# Patient Record
Sex: Female | Born: 1965 | Race: White | Hispanic: No | Smoking: Current every day smoker
Health system: Southern US, Community
[De-identification: ages and names within clinical notes are randomized; demographics above are authoritative.]

## PROBLEM LIST (undated history)

## (undated) HISTORY — PX: TUBAL LIGATION: SHX77

## (undated) HISTORY — PX: TONSILLECTOMY: SUR1361

---

## 1998-05-12 ENCOUNTER — Emergency Department (HOSPITAL_COMMUNITY): Admission: EM | Admit: 1998-05-12 | Discharge: 1998-05-12 | Payer: Self-pay | Admitting: Emergency Medicine

## 1998-11-15 ENCOUNTER — Emergency Department (HOSPITAL_COMMUNITY): Admission: EM | Admit: 1998-11-15 | Discharge: 1998-11-15 | Payer: Self-pay | Admitting: Internal Medicine

## 1998-11-15 ENCOUNTER — Encounter: Payer: Self-pay | Admitting: Internal Medicine

## 1999-03-29 ENCOUNTER — Emergency Department (HOSPITAL_COMMUNITY): Admission: EM | Admit: 1999-03-29 | Discharge: 1999-03-29 | Payer: Self-pay | Admitting: *Deleted

## 1999-03-29 ENCOUNTER — Encounter: Payer: Self-pay | Admitting: *Deleted

## 2000-08-13 ENCOUNTER — Emergency Department (HOSPITAL_COMMUNITY): Admission: EM | Admit: 2000-08-13 | Discharge: 2000-08-13 | Payer: Self-pay | Admitting: *Deleted

## 2003-05-10 ENCOUNTER — Encounter: Payer: Self-pay | Admitting: Emergency Medicine

## 2003-05-10 ENCOUNTER — Emergency Department (HOSPITAL_COMMUNITY): Admission: AD | Admit: 2003-05-10 | Discharge: 2003-05-10 | Payer: Self-pay | Admitting: Emergency Medicine

## 2003-12-21 ENCOUNTER — Emergency Department (HOSPITAL_COMMUNITY): Admission: AC | Admit: 2003-12-21 | Discharge: 2003-12-21 | Payer: Self-pay

## 2004-07-30 ENCOUNTER — Emergency Department (HOSPITAL_COMMUNITY): Admission: EM | Admit: 2004-07-30 | Discharge: 2004-07-30 | Payer: Self-pay | Admitting: Emergency Medicine

## 2004-09-05 ENCOUNTER — Emergency Department (HOSPITAL_COMMUNITY): Admission: EM | Admit: 2004-09-05 | Discharge: 2004-09-05 | Payer: Self-pay | Admitting: Emergency Medicine

## 2006-09-18 ENCOUNTER — Emergency Department (HOSPITAL_COMMUNITY): Admission: EM | Admit: 2006-09-18 | Discharge: 2006-09-18 | Payer: Self-pay | Admitting: Emergency Medicine

## 2007-08-22 ENCOUNTER — Emergency Department (HOSPITAL_COMMUNITY): Admission: EM | Admit: 2007-08-22 | Discharge: 2007-08-22 | Payer: Self-pay | Admitting: Emergency Medicine

## 2007-08-26 ENCOUNTER — Ambulatory Visit (HOSPITAL_COMMUNITY): Admission: RE | Admit: 2007-08-26 | Discharge: 2007-08-26 | Payer: Self-pay | Admitting: Gastroenterology

## 2007-08-26 ENCOUNTER — Encounter (INDEPENDENT_AMBULATORY_CARE_PROVIDER_SITE_OTHER): Payer: Self-pay | Admitting: Gastroenterology

## 2007-09-17 ENCOUNTER — Encounter: Admission: RE | Admit: 2007-09-17 | Discharge: 2007-09-17 | Payer: Self-pay | Admitting: Gastroenterology

## 2007-10-25 ENCOUNTER — Emergency Department (HOSPITAL_COMMUNITY): Admission: EM | Admit: 2007-10-25 | Discharge: 2007-10-25 | Payer: Self-pay | Admitting: Emergency Medicine

## 2007-10-26 ENCOUNTER — Ambulatory Visit: Payer: Self-pay | Admitting: Psychiatry

## 2007-10-26 ENCOUNTER — Inpatient Hospital Stay (HOSPITAL_COMMUNITY): Admission: RE | Admit: 2007-10-26 | Discharge: 2007-10-28 | Payer: Self-pay | Admitting: Psychiatry

## 2008-06-25 ENCOUNTER — Emergency Department (HOSPITAL_BASED_OUTPATIENT_CLINIC_OR_DEPARTMENT_OTHER): Admission: EM | Admit: 2008-06-25 | Discharge: 2008-06-25 | Payer: Self-pay | Admitting: Emergency Medicine

## 2008-06-27 ENCOUNTER — Emergency Department (HOSPITAL_BASED_OUTPATIENT_CLINIC_OR_DEPARTMENT_OTHER): Admission: EM | Admit: 2008-06-27 | Discharge: 2008-06-27 | Payer: Self-pay | Admitting: Emergency Medicine

## 2008-07-21 ENCOUNTER — Emergency Department (HOSPITAL_BASED_OUTPATIENT_CLINIC_OR_DEPARTMENT_OTHER): Admission: EM | Admit: 2008-07-21 | Discharge: 2008-07-21 | Payer: Self-pay | Admitting: Emergency Medicine

## 2009-04-10 ENCOUNTER — Emergency Department (HOSPITAL_BASED_OUTPATIENT_CLINIC_OR_DEPARTMENT_OTHER): Admission: EM | Admit: 2009-04-10 | Discharge: 2009-04-10 | Payer: Self-pay | Admitting: Emergency Medicine

## 2009-04-12 ENCOUNTER — Emergency Department (HOSPITAL_BASED_OUTPATIENT_CLINIC_OR_DEPARTMENT_OTHER): Admission: EM | Admit: 2009-04-12 | Discharge: 2009-04-12 | Payer: Self-pay | Admitting: Emergency Medicine

## 2009-12-16 ENCOUNTER — Emergency Department (HOSPITAL_BASED_OUTPATIENT_CLINIC_OR_DEPARTMENT_OTHER): Admission: EM | Admit: 2009-12-16 | Discharge: 2009-12-16 | Payer: Self-pay | Admitting: Emergency Medicine

## 2009-12-16 ENCOUNTER — Ambulatory Visit: Payer: Self-pay | Admitting: Diagnostic Radiology

## 2010-05-14 ENCOUNTER — Emergency Department (HOSPITAL_BASED_OUTPATIENT_CLINIC_OR_DEPARTMENT_OTHER): Admission: EM | Admit: 2010-05-14 | Discharge: 2010-05-14 | Payer: Self-pay | Admitting: Emergency Medicine

## 2011-02-24 NOTE — H&P (Signed)
NAMECATARINA, [name] NO.:  [id number]   MEDICAL RECORD NO.:  [id number]          PATIENT TYPE:  IPS   LOCATION:  0306                           FACILITY:   PHYSICIAN:  Jasmine Pang, M.D. DATE OF BIRTH:  Oct 27, 1965   DATE OF ADMISSION:  10/26/2007  DATE OF DISCHARGE:                       PSYCHIATRIC ADMISSION ASSESSMENT   HISTORY OF PRESENT ILLNESS:  The patient reports feeling depressed,  feeling very overwhelmed, states she has cycles of depression and her  depression right now is at its worse.  She has been experiencing mood  swings, feeling impatient and anxious and irritable with decreased  energy, decreased sleep and decreased appetite.  She states that she  denies any specific stressors although she does admit she is  experiencing some stress at work.  Her adult son is making some bad choices.  She has a history of problems  with nausea, vomiting.  Had an extensive workup.   PAST PSYCHIATRIC HISTORY:  First admission to the [facility].  Has  had appointment with a therapist on January 24, that will be a first  appointment, for [name].   SOCIAL HISTORY:  The patient resides in [place].  She is a 45-year-  old single female, has two children, age 26 and 19.  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  She states she has a boyfriend who she  states is supportive.   FAMILY HISTORY:  Mother with depression, not on any medications.   ALCOHOL AND DRUG HISTORY:  The patient smokes, drinks alcohol on rare  occasions, smokes marijuana joint daily.  Primary care Delina Kruczek [facility]  Her gastroenterologist is Dr. Elnoria Howard.  Medical problems are GERD.   MEDICATIONS:  Been on Zoloft 50 mg to start a few days prior and some  Klonopin prescribed by Dani Gobble.   DRUG ALLERGIES:  No known allergies.   PHYSICAL EXAM:  The patient was fully assessed at [facility] emergency  department.  Her temperature is  97.6, 62 heart rate, 16 respirations.  Her blood pressure is 116/67, she is 5 feet 5 inches tall, 100 pounds.  Her urine drug screen positive for benzodiazepines, positive for THC.  BMET is within normal limits.  Alcohol level less than 5, MCV is 105.1.   MENTAL STATUS EXAM:  She is fully alert, cooperative, casually dressed,  fair eye contact, thin, her speech is soft-spoken and normal pace.  Mood  is depressed.  Affect is somewhat flat, got teary-eyed a few times  throughout the interview and appears sad.  Her thought process are  coherent and goal directed.  No evidence of any thought disorder,  cognitive function intact.  Memory is good.  Judgment, insight is fair.  AXIS I:  Mood disorder NOS.  THC abuse.  AXIS II:  Deferred.  AXIS III:  Gastroesophageal reflux disease.  AXIS IV:  Psychosocial problems.  Medical problems, possible problems  with occupation.  AXIS V:  Current is 45   Plan to contract for safety.  We will stabilize mood, thinking.  Was  noted the  patient did sign her 72 hour request for discharge.  She is  agreeable to staying, to begin Seroquel at bedtime to help stabilize her  mood.  Risks and benefits were discussed.  The patient is agreeable.  Will continue with the Zoloft for now.  Case manager is to ascertain her  follow-up, will also address her substance use.  Her tentative length of  stay is 2-3 days.      Landry Corporal, N.P.      Jasmine Pang, M.D.  Electronically Signed    JO/MEDQ  D:  10/27/2007  T:  10/27/2007  Job:  [id number]

## 2011-02-24 NOTE — Discharge Summary (Signed)
[name], [name] NO.:  [id number]   MEDICAL RECORD NO.:  [id number]          PATIENT TYPE:  IPS   LOCATION:  0306                          FACILITY:  [facility]   PHYSICIAN:  Jasmine Pang, M.D. DATE OF BIRTH:  May 05, 1966   DATE OF ADMISSION:  10/26/2007  DATE OF DISCHARGE:  10/28/2007                               DISCHARGE SUMMARY   IDENTIFICATION:  This is a 45 year old single female who was admitted on  October 26, 2007.   HISTORY OF PRESENT ILLNESS:  The patient reports feeling depressed,  feeling very overwhelmed.  She states she has cycles of depression and  depression right now is at its worst.  She has been experiencing mood  swings, feeling impatient, anxious, irritable with decreased energy,  decreased sleep, and decreased appetite.  She states that she denies any  specific stressors, although she does admit she is experiencing some  stress at work.  Her adult son is making some bad choices, which is  upsetting for her.  She has a history of problems with nausea and  vomiting.  She had an extensive workup for this.   PAST PSYCHIATRIC HISTORY:  This is the first admission to the [facility].  She has an appointment with a therapist on  November 05, 2007, and that will be the first appointment for [name].   FAMILY HISTORY:  Mother has a history of depression.  She is not on any  medications.   ALCOHOL AND DRUG HISTORY:  The patient smokes, drinks alcohol on rare  occasions, and smokes marijuana joint daily.   MEDICAL PROBLEMS:  Gastroesophageal reflux disease.   MEDICATIONS:  Zoloft 50 mg daily, which was just started a few days  prior and Klonopin prescribed by Payton Spark, her primary care  Odyn Turko.   DRUG ALLERGIES:  No known drug allergies.   PHYSICAL FINDINGS:  The patient was fully assessed at [facility]  Emergency Department.  There were no acute physical or medical problems  noted.   DIAGNOSTIC STUDIES:  Urine drug screen was positive for benzodiazepines  and positive for THC.  BMET was within normal limits.  Alcohol level was  less than 5.  MCV was 105.1.   HOSPITAL COURSE:  Upon admission, the patient was continued on Zoloft 50  mg p.o. q.h.s.  She was also started on Seroquel 50 mg p.o. q.h.s. and  Seroquel 50 mg p.o. q.6 h. p.r.n. anxiety.  On October 28, 2007, due to  sedation, her Seroquel was decreased to 25 mg p.o. q.6 h. p.r.n.  anxiety.  The patient tolerated these medications well except for the  initial sedation onthe larger dose of Seroquel.  She was initially  reserved, but cooperative in individual sessions with me.  She discussed  her depression and feeling overwhelmed.  She states she came in to the  hospital voluntarily.  She states she has some stress including worry  about her 77 year old son and her mother, as well as relationship  problems.  She immediately denied suicidal ideation when I met with her  and  states that she would not do this.  On October 28, 2007, mental  status had improved from admission status.  The patient's sleep was  good.  Appetite was good.  Mood was less depressed and less anxious.  Affect was consistent with mood.  There was no suicidal or homicidal  ideation.  No thoughts of self-injurious behavior.  No auditory or  visual hallucinations.  No paranoia or delusions.  Thoughts were logical  and goal-directed.  Thought content, no predominant theme.  Cognitive  was grossly back to baseline.  She wanted to go home that day and was  felt to be safe for discharge.   DISCHARGE DIAGNOSES:  Axis I:  Mood disorder, not otherwise specified.  Tetrahydrocannabinol abuse.  Axis II:  None.  Axis III:  Gastroesophageal reflux disease.  Axis IV:  Moderate (psychosocial problems, medical problems, possible  problems with occupation, and burden of psychiatric illness).  Axis V:  Global assessment of functioning  upon discharge was 50.   Global assessment of functioningupon admission was 35.  Global  assessment of functioning, highest past year was 65.   DISCHARGE PLANS:  There are no specific activity level or dietary  restrictions.   POST HOSPITAL CARE PLANS:  The patient will see Dr. Evelene Croon on December 10, 2007, at 4:15 p.m.   DISCHARGE MEDICATIONS:  1. Seroquel 25 mg at bedtime and 25 mg q.6 h. if needed for anxiety.  2. Zoloft 50 mg at bedtime.  3. Ambien 5 mg at bedtime p.r.n. insomnia.      Jasmine Pang, M.D.  Electronically Signed     BHS/MEDQ  D:  11/16/2007  T:  11/17/2007  Job:  [id number]

## 2011-07-02 LAB — CBC
Platelets: 248
RBC: 3.57 — ABNORMAL LOW
WBC: 6.5

## 2011-07-02 LAB — BASIC METABOLIC PANEL
CO2: 24
Calcium: 9
Chloride: 108
GFR calc Af Amer: >60
GFR calc non Af Amer: >60
Glucose, Bld: 88

## 2011-07-02 LAB — DIFFERENTIAL
Basophils Relative: 1
Eosinophils Absolute: 0.1
Lymphs Abs: 1.3
Monocytes Absolute: 0.4

## 2011-07-02 LAB — I-STAT 8, (EC8 V) (CONVERTED LAB)
Acid-base deficit: 1
Bicarbonate: 23.6
Glucose, Bld: 88
Hemoglobin: 13.9
Operator id: 198171
Potassium: 4.2
pCO2, Ven: 38.6 — ABNORMAL LOW
pH, Ven: 7.394 — ABNORMAL HIGH

## 2011-07-02 LAB — RAPID URINE DRUG SCREEN, HOSP PERFORMED
Cocaine: NOT DETECTED
Tetrahydrocannabinol: POSITIVE — AB

## 2011-07-14 LAB — WOUND CULTURE

## 2011-07-21 LAB — COMPREHENSIVE METABOLIC PANEL
Calcium: 8.9
Chloride: 105
GFR calc Af Amer: >60
GFR calc non Af Amer: >60
Glucose, Bld: 95
Total Bilirubin: 0.4

## 2011-07-21 LAB — URINALYSIS, ROUTINE W REFLEX MICROSCOPIC
Bilirubin Urine: NEGATIVE
Ketones, ur: NEGATIVE
Nitrite: POSITIVE — AB
Protein, ur: NEGATIVE
Specific Gravity, Urine: 1.021
Urobilinogen, UA: 0.2

## 2011-07-21 LAB — WET PREP, GENITAL

## 2011-07-21 LAB — CBC
MCHC: 33.8
RBC: 3.9
RDW: 12.6

## 2011-07-21 LAB — AMYLASE: Amylase: 95

## 2011-07-21 LAB — DIFFERENTIAL
Basophils Absolute: 0
Basophils Relative: 1
Eosinophils Absolute: 0.2
Eosinophils Relative: 2
Lymphs Abs: 1.1
Monocytes Absolute: 0.6
Monocytes Relative: 10
Neutrophils Relative %: 70

## 2011-07-21 LAB — URINE CULTURE: Colony Count: >=100000

## 2011-07-21 LAB — GC/CHLAMYDIA PROBE AMP, GENITAL: GC Probe Amp, Genital: NEGATIVE

## 2011-11-04 ENCOUNTER — Emergency Department (INDEPENDENT_AMBULATORY_CARE_PROVIDER_SITE_OTHER): Payer: Self-pay

## 2011-11-04 ENCOUNTER — Emergency Department (HOSPITAL_BASED_OUTPATIENT_CLINIC_OR_DEPARTMENT_OTHER)
Admission: EM | Admit: 2011-11-04 | Discharge: 2011-11-04 | Disposition: A | Discharge status: Home / Self Care | Payer: Self-pay | Attending: Emergency Medicine | Admitting: Emergency Medicine

## 2011-11-04 ENCOUNTER — Encounter (HOSPITAL_BASED_OUTPATIENT_CLINIC_OR_DEPARTMENT_OTHER): Payer: Self-pay

## 2011-11-04 DIAGNOSIS — J42 Unspecified chronic bronchitis: Secondary | ICD-10-CM | POA: Insufficient documentation

## 2011-11-04 DIAGNOSIS — R05 Cough: Secondary | ICD-10-CM | POA: Insufficient documentation

## 2011-11-04 DIAGNOSIS — R079 Chest pain, unspecified: Secondary | ICD-10-CM

## 2011-11-04 DIAGNOSIS — R059 Cough, unspecified: Secondary | ICD-10-CM | POA: Insufficient documentation

## 2011-11-04 MED ORDER — PREDNISONE 10 MG PO TABS: 20.0000 mg | ORAL_TABLET | Freq: Every day | ORAL | Status: DC

## 2011-11-04 MED ORDER — HYDROCODONE-ACETAMINOPHEN 5-325 MG PO TABS: 2.0000 | ORAL_TABLET | ORAL | Status: AC | PRN

## 2011-11-04 MED ORDER — ALBUTEROL SULFATE HFA 108 (90 BASE) MCG/ACT IN AERS: 1.0000 | INHALATION_SPRAY | Freq: Four times a day (QID) | RESPIRATORY_TRACT | Status: DC | PRN

## 2011-11-04 NOTE — ED Notes (Signed)
MD at bedside. 

## 2011-11-04 NOTE — ED Provider Notes (Signed)
History     CSN: [id number]  Arrival date & time 11/04/11  1478   First MD Initiated Contact with Patient 11/04/11 [phone]      Chief Complaint  Patient presents with  . Cough     HPI Patient reports nonproductive cough for one month.  Patient states has had no fever or chills.  Patient has increasing pain when she coughs hard.  Patient recently stopped smoking.  Patient denies a Weight loss.  Patient does hear her wheezing especially at night. History reviewed. No pertinent past medical history.  History reviewed. No pertinent past surgical history.  History reviewed. No pertinent family history.  History  Substance Use Topics  . Smoking status: Former Smoker    Quit date: 10/04/2011  . Smokeless tobacco: Not on file  . Alcohol Use: Yes     beer 3 x week    OB History    Grav Para Term Preterm Abortions TAB SAB Ect Mult Living                  Review of Systems Remaining review of review of systems negative except as noted in history of present illness Allergies  Review of patient's allergies indicates no known allergies.  Home Medications   Current Outpatient Rx  Name Route Sig Dispense Refill  . ALBUTEROL SULFATE HFA 108 (90 BASE) MCG/ACT IN AERS Inhalation Inhale 1-2 puffs into the lungs every 6 (six) hours as needed for wheezing. 1 Inhaler 0  . HYDROCODONE-ACETAMINOPHEN 5-325 MG PO TABS Oral Take 2 tablets by mouth every 4 (four) hours as needed for pain. 10 tablet 0  . PREDNISONE 10 MG PO TABS Oral Take 2 tablets (20 mg total) by mouth daily. 15 tablet 0    BP 116/81  Pulse 81  Temp(Src) 98.3 F (36.8 C) (Oral)  Resp 16  Ht 5\' 5"  (1.651 m)  Wt 112 lb (50.803 kg)  BMI 18.64 kg/m2  SpO2 100%  LMP 10/28/2011  Physical Exam  Nursing note and vitals reviewed. Constitutional: She is oriented to person, place, and time. She appears well-developed and well-nourished. No distress.  HENT:  Head: Normocephalic and atraumatic.  Eyes: Pupils are equal, round,  and reactive to light.  Neck: Normal range of motion.  Cardiovascular: Normal rate and intact distal pulses.   Pulmonary/Chest: Effort normal. No respiratory distress. She has wheezes.  Abdominal: Normal appearance. She exhibits no distension.  Musculoskeletal: Normal range of motion.  Lymphadenopathy:    She has no cervical adenopathy.    She has no axillary adenopathy.  Neurological: She is alert and oriented to person, place, and time. No cranial nerve deficit.  Skin: Skin is warm and dry. No rash noted.  Psychiatric: She has a normal mood and affect. Her behavior is normal.    ED Course  Procedures (including critical care time)  Labs Reviewed - No data to display Dg Chest 2 View  11/04/2011  *RADIOLOGY REPORT*  Clinical Data: Cough, bilateral rib pain.  CHEST - 2 VIEW  Comparison: 12/16/2009  Findings: Mild hyperinflation of the lungs.  This is stable. Heart and mediastinal contours are within normal limits.  No focal opacities or effusions.  No acute bony abnormality. Stable biapical scarring.  IMPRESSION: Stable mild hyperinflation.  No active disease.  Original Report Authenticated By: Cyndie Chime, M.D.     1. Chronic bronchitis       MDM          Nelia Shi, MD 11/04/11  0939 

## 2011-11-04 NOTE — ED Notes (Signed)
Patient transported to X-ray 

## 2011-11-04 NOTE — ED Notes (Signed)
Pt reports a nonproductive cough x 1 month.

## 2011-12-02 ENCOUNTER — Other Ambulatory Visit (HOSPITAL_COMMUNITY): Payer: Self-pay | Admitting: Family Medicine

## 2011-12-02 DIAGNOSIS — R0781 Pleurodynia: Secondary | ICD-10-CM

## 2011-12-03 ENCOUNTER — Encounter (HOSPITAL_COMMUNITY): Payer: Self-pay

## 2011-12-03 ENCOUNTER — Ambulatory Visit (HOSPITAL_COMMUNITY)
Admission: RE | Admit: 2011-12-03 | Discharge: 2011-12-03 | Disposition: A | Discharge status: Home / Self Care | Payer: Self-pay | Source: Ambulatory Visit | Attending: Family Medicine | Admitting: Family Medicine

## 2011-12-03 DIAGNOSIS — R0781 Pleurodynia: Secondary | ICD-10-CM

## 2011-12-03 DIAGNOSIS — R079 Chest pain, unspecified: Secondary | ICD-10-CM | POA: Insufficient documentation

## 2011-12-03 DIAGNOSIS — J438 Other emphysema: Secondary | ICD-10-CM | POA: Insufficient documentation

## 2011-12-03 DIAGNOSIS — R0602 Shortness of breath: Secondary | ICD-10-CM | POA: Insufficient documentation

## 2012-12-05 ENCOUNTER — Encounter (HOSPITAL_COMMUNITY): Payer: Self-pay | Admitting: *Deleted

## 2012-12-13 ENCOUNTER — Encounter (HOSPITAL_COMMUNITY): Payer: Self-pay

## 2012-12-13 ENCOUNTER — Ambulatory Visit (HOSPITAL_COMMUNITY)
Admission: RE | Admit: 2012-12-13 | Discharge: 2012-12-13 | Disposition: A | Discharge status: Home / Self Care | Payer: Self-pay | Source: Ambulatory Visit | Attending: Obstetrics and Gynecology | Admitting: Obstetrics and Gynecology

## 2012-12-13 ENCOUNTER — Other Ambulatory Visit: Payer: Self-pay | Admitting: Obstetrics and Gynecology

## 2012-12-13 VITALS — BP 86/58 | Temp 97.8°F | Ht 66.0 in | Wt 120.0 lb

## 2012-12-13 DIAGNOSIS — N644 Mastodynia: Secondary | ICD-10-CM

## 2012-12-13 DIAGNOSIS — N6325 Unspecified lump in the left breast, overlapping quadrants: Secondary | ICD-10-CM

## 2012-12-13 DIAGNOSIS — M79621 Pain in right upper arm: Secondary | ICD-10-CM

## 2012-12-13 DIAGNOSIS — Z01419 Encounter for gynecological examination (general) (routine) without abnormal findings: Secondary | ICD-10-CM

## 2012-12-13 NOTE — Addendum Note (Signed)
Encounter addended by: Saintclair Halsted, RN on: 12/13/2012  3:33 PM<BR>     Documentation filed: Notes Section

## 2012-12-13 NOTE — Progress Notes (Addendum)
Patient complained of right axillary pain x 2 weeks that radiates down right side of breast. Patient rates pain at a 6-7 out of 10. Patient complained of abdominal bloating and gaining 20 lbs within past 3 weeks.   Pap Smear:    Pap smear completed today. Per patient last Pap smear was 3-4 years ago and normal. Per patient no history of abnormal Pap smears. No Pap smear results above are in EPIC.  Physical exam: Breasts Breasts symmetrical. No skin abnormalities bilateral breasts. No nipple retraction bilateral breasts. No nipple discharge bilateral breasts. Mild right axillary lymphadenopathy. No lumps palpated right breast. Palpated small moveable lump left left breast at 3 o'clock next to areola. Patient complained of tenderness when palpated right axilla. Referred patient to the [facility] for diagnostic mammogram. Appointment scheduled for Friday, December 23, 2012 at 1330.         Pelvic/Bimanual   Ext Genitalia No lesions, no swelling and no discharge observed on external genitalia.         Vagina Vagina pink and normal texture. No lesions or discharge observed in vagina.          Cervix Cervix is present. Cervix pink and of normal texture. Cervix friable. No discharge observed on cervix.     Uterus Uterus is present and palpable. Uterus tilted to left and slightly enlarged.        Adnexae Bilateral ovaries present and palpable. No tenderness on palpation.          Rectovaginal No rectal exam completed today since patient had no rectal complaints. No skin abnormalities observed on exam.

## 2012-12-13 NOTE — Patient Instructions (Signed)
Taught patient how to perform BSE. Let her know [facility] will cover Pap smears every 3 years unless has a history of abnormal Pap smears. Referred patient to the [facility] for diagnostic mammogram. Appointment scheduled for Friday, December 23, 2012 at 1330. Patient aware of appointment and will be there. Let patient know will follow up with her within the next couple weeks with results by letter or phone. Told patient will schedule her a follow up appointment at the [facility] to follow up for her bloating and 20 lb wt gain within the past 3 weeks. Told her will call her tomorrow with the appointment. Patient verbalized understanding.

## 2012-12-14 ENCOUNTER — Telehealth (HOSPITAL_COMMUNITY): Payer: Self-pay | Admitting: *Deleted

## 2012-12-14 NOTE — Telephone Encounter (Signed)
Patient returned call and advised of negative pap smear results. Next pap smear due in 3 years. Patient voiced understanding.

## 2012-12-14 NOTE — Telephone Encounter (Signed)
Telephoned patient at home # and left message to return call to [facility] 

## 2012-12-23 ENCOUNTER — Ambulatory Visit
Admission: RE | Admit: 2012-12-23 | Discharge: 2012-12-23 | Disposition: A | Discharge status: Home / Self Care | Payer: No Typology Code available for payment source | Source: Ambulatory Visit | Attending: Obstetrics and Gynecology | Admitting: Obstetrics and Gynecology

## 2012-12-23 ENCOUNTER — Other Ambulatory Visit: Payer: Self-pay | Admitting: Obstetrics and Gynecology

## 2012-12-23 DIAGNOSIS — N6325 Unspecified lump in the left breast, overlapping quadrants: Secondary | ICD-10-CM

## 2012-12-23 DIAGNOSIS — N644 Mastodynia: Secondary | ICD-10-CM

## 2013-03-03 ENCOUNTER — Encounter: Payer: Self-pay | Admitting: Obstetrics & Gynecology

## 2013-11-01 ENCOUNTER — Encounter (HOSPITAL_BASED_OUTPATIENT_CLINIC_OR_DEPARTMENT_OTHER): Payer: Self-pay | Admitting: Emergency Medicine

## 2013-11-01 ENCOUNTER — Emergency Department (HOSPITAL_BASED_OUTPATIENT_CLINIC_OR_DEPARTMENT_OTHER): Payer: Self-pay

## 2013-11-01 ENCOUNTER — Emergency Department (HOSPITAL_BASED_OUTPATIENT_CLINIC_OR_DEPARTMENT_OTHER)
Admission: EM | Admit: 2013-11-01 | Discharge: 2013-11-01 | Disposition: A | Discharge status: Home / Self Care | Payer: Self-pay | Attending: Emergency Medicine | Admitting: Emergency Medicine

## 2013-11-01 DIAGNOSIS — F17200 Nicotine dependence, unspecified, uncomplicated: Secondary | ICD-10-CM | POA: Insufficient documentation

## 2013-11-01 DIAGNOSIS — IMO0002 Reserved for concepts with insufficient information to code with codable children: Secondary | ICD-10-CM | POA: Insufficient documentation

## 2013-11-01 DIAGNOSIS — M25569 Pain in unspecified knee: Secondary | ICD-10-CM | POA: Insufficient documentation

## 2013-11-01 DIAGNOSIS — M25562 Pain in left knee: Secondary | ICD-10-CM

## 2013-11-01 MED ORDER — IBUPROFEN 800 MG PO TABS: 800.0000 mg | ORAL_TABLET | Freq: Three times a day (TID) | ORAL | Status: DC

## 2013-11-01 MED ORDER — HYDROCODONE-ACETAMINOPHEN 5-325 MG PO TABS
2.0000 | ORAL_TABLET | ORAL | Status: DC | PRN
Start: 2013-11-01 — End: 2014-07-22

## 2013-11-01 NOTE — ED Provider Notes (Signed)
Medical screening examination/treatment/procedure(s) were performed by non-physician practitioner and as supervising physician I was immediately available for consultation/collaboration.  EKG Interpretation   None         Dagmar HaitWilliam Aymar Whitfill, MD 11/01/13 [phone]

## 2013-11-01 NOTE — ED Notes (Signed)
Pt c/o left knee pain w/o injury x 2 days 

## 2013-11-01 NOTE — ED Notes (Signed)
In to d/c pt-pt angry that she was given rx hydrocodone "that hydocodone ain't gonna do anything"-advised that combination of ibuprofen and hydrocodone should help her pain due to no known injury and negative xray-pt also refused knee sleeve stating "i got one of them at home"-EDPA notified of above

## 2013-11-01 NOTE — Discharge Instructions (Signed)
Arthralgia °Your caregiver has diagnosed you as suffering from an arthralgia. Arthralgia means there is pain in a joint. This can come from many reasons including: °· Bruising the joint which causes soreness (inflammation) in the joint. °· Wear and tear on the joints which occur as we grow older (osteoarthritis). °· Overusing the joint. °· Various forms of arthritis. °· Infections of the joint. °Regardless of the cause of pain in your joint, most of these different pains respond to anti-inflammatory drugs and rest. The exception to this is when a joint is infected, and these cases are treated with antibiotics, if it is a bacterial infection. °HOME CARE INSTRUCTIONS  °· Rest the injured area for as long as directed by your caregiver. Then slowly start using the joint as directed by your caregiver and as the pain allows. Crutches as directed may be useful if the ankles, knees or hips are involved. If the knee was splinted or casted, continue use and care as directed. If an stretchy or elastic wrapping bandage has been applied today, it should be removed and re-applied every 3 to 4 hours. It should not be applied tightly, but firmly enough to keep swelling down. Watch toes and feet for swelling, bluish discoloration, coldness, numbness or excessive pain. If any of these problems (symptoms) occur, remove the ace bandage and re-apply more loosely. If these symptoms persist, contact your caregiver or return to this location. °· For the first 24 hours, keep the injured extremity elevated on pillows while lying down. °· Apply ice for 15-20 minutes to the sore joint every couple hours while awake for the first half day. Then 03-04 times per day for the first 48 hours. Put the ice in a plastic bag and place a towel between the bag of ice and your skin. °· Wear any splinting, casting, elastic bandage applications, or slings as instructed. °· Only take over-the-counter or prescription medicines for pain, discomfort, or fever as  directed by your caregiver. Do not use aspirin immediately after the injury unless instructed by your physician. Aspirin can cause increased bleeding and bruising of the tissues. °· If you were given crutches, continue to use them as instructed and do not resume weight bearing on the sore joint until instructed. °Persistent pain and inability to use the sore joint as directed for more than 2 to 3 days are warning signs indicating that you should see a caregiver for a follow-up visit as soon as possible. Initially, a hairline fracture (break in bone) may not be evident on X-rays. Persistent pain and swelling indicate that further evaluation, non-weight bearing or use of the joint (use of crutches or slings as instructed), or further X-rays are indicated. X-rays may sometimes not show a small fracture until a week or 10 days later. Make a follow-up appointment with your own caregiver or one to whom we have referred you. A radiologist (specialist in reading X-rays) may read your X-rays. Make sure you know how you are to obtain your X-ray results. Do not assume everything is normal if you do not hear from us. °SEEK MEDICAL CARE IF: °Bruising, swelling, or pain increases. °SEEK IMMEDIATE MEDICAL CARE IF:  °· Your fingers or toes are numb or blue. °· The pain is not responding to medications and continues to stay the same or get worse. °· The pain in your joint becomes severe. °· You develop a fever over 102° F (38.9° C). °· It becomes impossible to move or use the joint. °MAKE SURE YOU:  °·   Understand these instructions. °· Will watch your condition. °· Will get help right away if you are not doing well or get worse. °Document Released: 09/28/2005 Document Revised: 12/21/2011 Document Reviewed: 05/16/2008 °ExitCare® Patient Information ©2014 [employer]. ° °Knee Pain °Knee pain can be a result of an injury or other medical conditions. Treatment will depend on the cause of your pain. °HOME CARE °· Only take medicine as  told by your doctor. °· Keep a healthy weight. Being overweight can make the knee hurt more. °· Stretch before exercising or playing sports. °· If there is constant knee pain, change the way you exercise. Ask your doctor for advice. °· Make sure shoes fit well. Choose the right shoe for the sport or activity. °· Protect your knees. Wear kneepads if needed. °· Rest when you are tired. °GET HELP RIGHT AWAY IF:  °· Your knee pain does not stop. °· Your knee pain does not get better. °· Your knee joint feels hot to the touch. °· You have a fever. °MAKE SURE YOU:  °· Understand these instructions. °· Will watch this condition. °· Will get help right away if you are not doing well or get worse. °Document Released: 12/25/2008 Document Revised: 12/21/2011 Document Reviewed: 12/25/2008 °ExitCare® Patient Information ©2014 [employer]. ° °

## 2013-11-01 NOTE — ED Provider Notes (Signed)
CSN: [id number]     Arrival date & time 11/01/13  1527 History   First MD Initiated Contact with Patient 11/01/13 1537     Chief Complaint  Patient presents with  . Knee Pain   (Consider location/radiation/quality/duration/timing/severity/associated sxs/prior Treatment) Patient is a 48 y.o. female presenting with knee pain. The history is provided by the patient. No language interpreter was used.  Knee Pain Location:  Knee Time since incident:  2 days Injury: no   Knee location:  L knee Pain details:    Quality:  Aching   Radiates to:  Does not radiate   Severity:  Moderate   Onset quality:  Gradual   Duration:  2 days   Timing:  Constant   Progression:  Worsening Chronicity:  New Dislocation: no   Foreign body present:  No foreign bodies Prior injury to area:  No Worsened by:  Nothing tried Pt reports no injury,  Slight swelling above knee cap.    History reviewed. No pertinent past medical history. Past Surgical History  Procedure Laterality Date  . Tubal ligation    . Tonsillectomy     Family History  Problem Relation Age of Onset  . Diabetes Mother   . Hypertension Mother   . Cancer Mother     breast  . COPD Father   . Diabetes Maternal Grandmother   . Hypertension Maternal Grandmother   . Stroke Maternal Grandmother   . Heart disease Maternal Grandmother    History  Substance Use Topics  . Smoking status: Current Every Day Smoker -- 1.00 packs/day for 31 years    Types: Cigarettes  . Smokeless tobacco: Never Used  . Alcohol Use: Yes     Comment: beer 3 x week   OB History   Grav Para Term Preterm Abortions TAB SAB Ect Mult Living   4 2 2  2  2   2      Review of Systems  Musculoskeletal: Positive for joint swelling and myalgias.  All other systems reviewed and are negative.    Allergies  Review of patient's allergies indicates no known allergies.  Home Medications   Current Outpatient Rx  Name  Route  Sig  Dispense  Refill  . EXPIRED:  albuterol (PROVENTIL HFA;VENTOLIN HFA) 108 (90 BASE) MCG/ACT inhaler   Inhalation   Inhale 1-2 puffs into the lungs every 6 (six) hours as needed for wheezing.   1 Inhaler   0   . predniSONE (DELTASONE) 10 MG tablet   Oral   Take 2 tablets (20 mg total) by mouth daily.   15 tablet   0    BP 139/84  Pulse 76  Temp(Src) 97.9 F (36.6 C) (Oral)  Resp 16  Ht 5\' 6"  (1.676 m)  Wt 130 lb (58.968 kg)  BMI 20.99 kg/m2  SpO2 100%  LMP 11/01/2013 Physical Exam  Nursing note and vitals reviewed. Constitutional: She is oriented to person, place, and time. She appears well-developed and well-nourished.  HENT:  Head: Normocephalic and atraumatic.  Musculoskeletal: She exhibits tenderness.  Swollen area above left knee,   From,  nv and ns intact  Neurological: She is alert and oriented to person, place, and time. She has normal reflexes.  Skin: Skin is warm.  Psychiatric: She has a normal mood and affect.    ED Course  Procedures (including critical care time) Labs Review Labs Reviewed - No data to display Imaging Review No results found.  EKG Interpretation   None  MDM   1. Knee pain, left    knee sleeve,  Ibuprofen and followup with Dr. Allena Napoleonhudnall   Skyeler Scalese K Jefferie Holston, PA-C 11/01/13 1704

## 2014-03-16 ENCOUNTER — Emergency Department (HOSPITAL_BASED_OUTPATIENT_CLINIC_OR_DEPARTMENT_OTHER)
Admission: EM | Admit: 2014-03-16 | Discharge: 2014-03-17 | Disposition: A | Discharge status: Home / Self Care | Payer: Self-pay | Attending: Emergency Medicine | Admitting: Emergency Medicine

## 2014-03-16 ENCOUNTER — Emergency Department (HOSPITAL_BASED_OUTPATIENT_CLINIC_OR_DEPARTMENT_OTHER): Payer: Self-pay

## 2014-03-16 ENCOUNTER — Encounter (HOSPITAL_BASED_OUTPATIENT_CLINIC_OR_DEPARTMENT_OTHER): Payer: Self-pay | Admitting: Emergency Medicine

## 2014-03-16 DIAGNOSIS — Y92410 Unspecified street and highway as the place of occurrence of the external cause: Secondary | ICD-10-CM | POA: Insufficient documentation

## 2014-03-16 DIAGNOSIS — S0003XA Contusion of scalp, initial encounter: Secondary | ICD-10-CM | POA: Insufficient documentation

## 2014-03-16 DIAGNOSIS — Z791 Long term (current) use of non-steroidal anti-inflammatories (NSAID): Secondary | ICD-10-CM | POA: Insufficient documentation

## 2014-03-16 DIAGNOSIS — S0510XA Contusion of eyeball and orbital tissues, unspecified eye, initial encounter: Secondary | ICD-10-CM | POA: Insufficient documentation

## 2014-03-16 DIAGNOSIS — IMO0002 Reserved for concepts with insufficient information to code with codable children: Secondary | ICD-10-CM | POA: Insufficient documentation

## 2014-03-16 DIAGNOSIS — S1093XA Contusion of unspecified part of neck, initial encounter: Secondary | ICD-10-CM | POA: Insufficient documentation

## 2014-03-16 DIAGNOSIS — R55 Syncope and collapse: Secondary | ICD-10-CM | POA: Insufficient documentation

## 2014-03-16 DIAGNOSIS — Y9389 Activity, other specified: Secondary | ICD-10-CM | POA: Insufficient documentation

## 2014-03-16 DIAGNOSIS — S0083XA Contusion of other part of head, initial encounter: Secondary | ICD-10-CM | POA: Insufficient documentation

## 2014-03-16 DIAGNOSIS — F17200 Nicotine dependence, unspecified, uncomplicated: Secondary | ICD-10-CM | POA: Insufficient documentation

## 2014-03-16 LAB — CBC WITH DIFFERENTIAL/PLATELET
Basophils Absolute: 0 10*3/uL (ref 0.0–0.1)
Basophils Relative: 1 % (ref 0–1)
EOS PCT: 1 % (ref 0–5)
Eosinophils Absolute: 0.1 10*3/uL (ref 0.0–0.7)
HCT: 41.5 % (ref 36.0–46.0)
Hemoglobin: 14.8 g/dL (ref 12.0–15.0)
LYMPHS ABS: 2 10*3/uL (ref 0.7–4.0)
LYMPHS PCT: 29 % (ref 12–46)
MCH: 36.1 pg — ABNORMAL HIGH (ref 26.0–34.0)
MCHC: 35.7 g/dL (ref 30.0–36.0)
MCV: 101.2 fL — AB (ref 78.0–100.0)
Monocytes Absolute: 0.7 10*3/uL (ref 0.1–1.0)
Monocytes Relative: 10 % (ref 3–12)
NEUTROS ABS: 4.2 10*3/uL (ref 1.7–7.7)
Neutrophils Relative %: 60 % (ref 43–77)
PLATELETS: 264 10*3/uL (ref 150–400)
RBC: 4.1 MIL/uL (ref 3.87–5.11)
RDW: 11.2 % — AB (ref 11.5–15.5)
WBC: 7 10*3/uL (ref 4.0–10.5)

## 2014-03-16 LAB — BASIC METABOLIC PANEL
BUN: 18 mg/dL (ref 6–23)
CALCIUM: 9.9 mg/dL (ref 8.4–10.5)
CO2: 19 meq/L (ref 19–32)
Chloride: 103 mEq/L (ref 96–112)
Creatinine, Ser: 1 mg/dL (ref 0.50–1.10)
GFR calc Af Amer: 77 mL/min — ABNORMAL LOW (ref >90)
GFR calc non Af Amer: 66 mL/min — ABNORMAL LOW (ref >90)
Glucose, Bld: 101 mg/dL — ABNORMAL HIGH (ref 70–99)
Potassium: 3.6 mEq/L — ABNORMAL LOW (ref 3.7–5.3)
SODIUM: 138 meq/L (ref 137–147)

## 2014-03-16 LAB — CBG MONITORING, ED: Glucose-Capillary: 103 mg/dL — ABNORMAL HIGH (ref 70–99)

## 2014-03-16 LAB — TROPONIN I

## 2014-03-16 MED ORDER — SODIUM CHLORIDE 0.9 % IV SOLN
INTRAVENOUS | Status: DC
  Administered 2014-03-16: 22:00:00 via INTRAVENOUS

## 2014-03-16 MED ORDER — ONDANSETRON 8 MG PO TBDP: 8.0000 mg | ORAL_TABLET | Freq: Three times a day (TID) | ORAL | Status: DC | PRN

## 2014-03-16 MED ORDER — SODIUM CHLORIDE 0.9 % IV SOLN: INTRAVENOUS | Status: DC

## 2014-03-16 MED ORDER — PROMETHAZINE HCL 25 MG PO TABS: 25.0000 mg | ORAL_TABLET | Freq: Four times a day (QID) | ORAL | Status: DC | PRN

## 2014-03-16 MED ORDER — ONDANSETRON HCL 4 MG/2ML IJ SOLN
4.0000 mg | Freq: Once | INTRAMUSCULAR | Status: AC
  Administered 2014-03-16: 4 mg via INTRAVENOUS
  Filled 2014-03-16: qty 2

## 2014-03-16 MED ORDER — SODIUM CHLORIDE 0.9 % IV BOLUS (SEPSIS)
1000.0000 mL | Freq: Once | INTRAVENOUS | Status: AC
  Administered 2014-03-16: 1000 mL via INTRAVENOUS

## 2014-03-16 NOTE — ED Notes (Signed)
I took cbg, got reading of 103 mg./dcltr.

## 2014-03-16 NOTE — Discharge Instructions (Signed)
Contusion A contusion is a deep bruise. Contusions are the result of an injury that caused bleeding under the skin. The contusion may turn blue, purple, or yellow. Minor injuries will give you a painless contusion, but more severe contusions may stay painful and swollen for a few weeks.  CAUSES  A contusion is usually caused by a blow, trauma, or direct force to an area of the body. SYMPTOMS   Swelling and redness of the injured area.  Bruising of the injured area.  Tenderness and soreness of the injured area.  Pain. DIAGNOSIS  The diagnosis can be made by taking a history and physical exam. An X-ray, CT scan, or MRI may be needed to determine if there were any associated injuries, such as fractures. TREATMENT  Specific treatment will depend on what area of the body was injured. In general, the best treatment for a contusion is resting, icing, elevating, and applying cold compresses to the injured area. Over-the-counter medicines may also be recommended for pain control. Ask your caregiver what the best treatment is for your contusion. HOME CARE INSTRUCTIONS   Put ice on the injured area.  Put ice in a plastic bag.  Place a towel between your skin and the bag.  Leave the ice on for 15-20 minutes, 03-04 times a day.  Only take over-the-counter or prescription medicines for pain, discomfort, or fever as directed by your caregiver. Your caregiver may recommend avoiding anti-inflammatory medicines (aspirin, ibuprofen, and naproxen) for 48 hours because these medicines may increase bruising.  Rest the injured area.  If possible, elevate the injured area to reduce swelling. SEEK IMMEDIATE MEDICAL CARE IF:   You have increased bruising or swelling.  You have pain that is getting worse.  Your swelling or pain is not relieved with medicines. MAKE SURE YOU:   Understand these instructions.  Will watch your condition.  Will get help right away if you are not doing well or get  worse. Document Released: 07/08/2005 Document Revised: 12/21/2011 Document Reviewed: 08/03/2011 [facility] Patient Information 2014 [place], [place]. Syncope Syncope is a fainting spell. This means the person loses consciousness and drops to the ground. The person is generally unconscious for less than 5 minutes. The person may have some muscle twitches for up to 15 seconds before waking up and returning to normal. Syncope occurs more often in elderly people, but it can happen to anyone. While most causes of syncope are not dangerous, syncope can be a sign of a serious medical problem. It is important to seek medical care.  CAUSES  Syncope is caused by a sudden decrease in blood flow to the brain. The specific cause is often not determined. Factors that can trigger syncope include:  Taking medicines that lower blood pressure.  Sudden changes in posture, such as standing up suddenly.  Taking more medicine than prescribed.  Standing in one place for too long.  Seizure disorders.  Dehydration and excessive exposure to heat.  Low blood sugar (hypoglycemia).  Straining to have a bowel movement.  Heart disease, irregular heartbeat, or other circulatory problems.  Fear, emotional distress, seeing blood, or severe pain. SYMPTOMS  Right before fainting, you may:  Feel dizzy or lightheaded.  Feel nauseous.  See all white or all black in your field of vision.  Have cold, clammy skin. DIAGNOSIS  Your caregiver will ask about your symptoms, perform a physical exam, and perform electrocardiography (ECG) to record the electrical activity of your heart. Your caregiver may also perform other heart or  blood tests to determine the cause of your syncope. TREATMENT  In most cases, no treatment is needed. Depending on the cause of your syncope, your caregiver may recommend changing or stopping some of your medicines. HOME CARE INSTRUCTIONS  Have someone stay with you until you feel stable.  Do  not drive, operate machinery, or play sports until your caregiver says it is okay.  Keep all follow-up appointments as directed by your caregiver.  Lie down right away if you start feeling like you might faint. Breathe deeply and steadily. Wait until all the symptoms have passed.  Drink enough fluids to keep your urine clear or pale yellow.  If you are taking blood pressure or heart medicine, get up slowly, taking several minutes to sit and then stand. This can reduce dizziness. SEEK IMMEDIATE MEDICAL CARE IF:   You have a severe headache.  You have unusual pain in the chest, abdomen, or back.  You are bleeding from the mouth or rectum, or you have black or tarry stool.  You have an irregular or very fast heartbeat.  You have pain with breathing.  You have repeated fainting or seizure-like jerking during an episode.  You faint when sitting or lying down.  You have confusion.  You have difficulty walking.  You have severe weakness.  You have vision problems. If you fainted, call your local emergency services (911 in [place] Do not drive yourself to the hospital.  MAKE SURE YOU:  Understand these instructions.  Will watch your condition.  Will get help right away if you are not doing well or get worse. Document Released: 09/28/2005 Document Revised: 03/29/2012 Document Reviewed: 11/27/2011 [facility] Patient Information 2014 [place].
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
## 2014-07-22 ENCOUNTER — Encounter (HOSPITAL_BASED_OUTPATIENT_CLINIC_OR_DEPARTMENT_OTHER): Payer: Self-pay | Admitting: Emergency Medicine

## 2014-07-22 ENCOUNTER — Emergency Department (HOSPITAL_COMMUNITY)
Admission: EM | Admit: 2014-07-22 | Discharge: 2014-07-22 | Disposition: A | Discharge status: Home / Self Care | Payer: Self-pay | Attending: Emergency Medicine | Admitting: Emergency Medicine

## 2014-07-22 ENCOUNTER — Emergency Department (HOSPITAL_BASED_OUTPATIENT_CLINIC_OR_DEPARTMENT_OTHER): Payer: Self-pay

## 2014-07-22 ENCOUNTER — Encounter (HOSPITAL_COMMUNITY): Payer: Self-pay | Admitting: Emergency Medicine

## 2014-07-22 ENCOUNTER — Emergency Department (HOSPITAL_COMMUNITY): Payer: Self-pay

## 2014-07-22 ENCOUNTER — Emergency Department (HOSPITAL_BASED_OUTPATIENT_CLINIC_OR_DEPARTMENT_OTHER)
Admission: EM | Admit: 2014-07-22 | Discharge: 2014-07-22 | Disposition: A | Discharge status: Home / Self Care | Payer: Self-pay | Attending: Emergency Medicine | Admitting: Emergency Medicine

## 2014-07-22 DIAGNOSIS — S60041A Contusion of right ring finger without damage to nail, initial encounter: Secondary | ICD-10-CM | POA: Insufficient documentation

## 2014-07-22 DIAGNOSIS — Z720 Tobacco use: Secondary | ICD-10-CM | POA: Insufficient documentation

## 2014-07-22 DIAGNOSIS — Z79899 Other long term (current) drug therapy: Secondary | ICD-10-CM | POA: Insufficient documentation

## 2014-07-22 DIAGNOSIS — S02401A Maxillary fracture, unspecified, initial encounter for closed fracture: Secondary | ICD-10-CM

## 2014-07-22 DIAGNOSIS — T148XXA Other injury of unspecified body region, initial encounter: Secondary | ICD-10-CM

## 2014-07-22 DIAGNOSIS — S60222A Contusion of left hand, initial encounter: Secondary | ICD-10-CM | POA: Insufficient documentation

## 2014-07-22 DIAGNOSIS — S5000XA Contusion of unspecified elbow, initial encounter: Secondary | ICD-10-CM | POA: Insufficient documentation

## 2014-07-22 DIAGNOSIS — S023XXA Fracture of orbital floor, initial encounter for closed fracture: Secondary | ICD-10-CM

## 2014-07-22 DIAGNOSIS — S00212A Abrasion of left eyelid and periocular area, initial encounter: Secondary | ICD-10-CM | POA: Insufficient documentation

## 2014-07-22 DIAGNOSIS — S0993XA Unspecified injury of face, initial encounter: Secondary | ICD-10-CM | POA: Insufficient documentation

## 2014-07-22 DIAGNOSIS — S0012XA Contusion of left eyelid and periocular area, initial encounter: Secondary | ICD-10-CM | POA: Insufficient documentation

## 2014-07-22 DIAGNOSIS — S60811A Abrasion of right wrist, initial encounter: Secondary | ICD-10-CM | POA: Insufficient documentation

## 2014-07-22 DIAGNOSIS — S60211A Contusion of right wrist, initial encounter: Secondary | ICD-10-CM | POA: Insufficient documentation

## 2014-07-22 DIAGNOSIS — S6990XA Unspecified injury of unspecified wrist, hand and finger(s), initial encounter: Secondary | ICD-10-CM | POA: Insufficient documentation

## 2014-07-22 MED ORDER — ONDANSETRON 4 MG PO TBDP: 4.0000 mg | ORAL_TABLET | Freq: Three times a day (TID) | ORAL | Status: DC | PRN

## 2014-07-22 MED ORDER — FENTANYL CITRATE 0.05 MG/ML IJ SOLN
50.0000 ug | Freq: Once | INTRAMUSCULAR | Status: AC
  Administered 2014-07-22: 50 ug via INTRAMUSCULAR
  Filled 2014-07-22: qty 2

## 2014-07-22 MED ORDER — HYDROCODONE-ACETAMINOPHEN 5-325 MG PO TABS
2.0000 | ORAL_TABLET | Freq: Once | ORAL | Status: AC
  Administered 2014-07-22: 2 via ORAL
  Filled 2014-07-22: qty 2

## 2014-07-22 MED ORDER — IBUPROFEN 400 MG PO TABS
600.0000 mg | ORAL_TABLET | Freq: Once | ORAL | Status: AC
  Administered 2014-07-22: 600 mg via ORAL
  Filled 2014-07-22: qty 2

## 2014-07-22 MED ORDER — HYDROCODONE-ACETAMINOPHEN 5-325 MG PO TABS: 2.0000 | ORAL_TABLET | ORAL | Status: DC | PRN

## 2014-07-22 MED ORDER — ONDANSETRON 4 MG PO TBDP
4.0000 mg | ORAL_TABLET | Freq: Once | ORAL | Status: AC
  Administered 2014-07-22: 4 mg via ORAL
  Filled 2014-07-22: qty 1

## 2014-07-22 NOTE — ED Notes (Signed)
[facility]
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
## 2014-07-22 NOTE — ED Notes (Signed)
Family member was tired of waiting.

## 2014-07-22 NOTE — ED Notes (Signed)
Pt. With nausea after taking pain meds. MD aware and orders received for zofran ODT
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
## 2014-07-22 NOTE — ED Notes (Signed)
Pt is going home with her friend and states feels safe. Also, states her family is coming to visit with her this morning.
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
## 2014-07-22 NOTE — ED Notes (Signed)
Pt in CT.

## 2014-08-10 ENCOUNTER — Emergency Department (HOSPITAL_BASED_OUTPATIENT_CLINIC_OR_DEPARTMENT_OTHER)
Admission: EM | Admit: 2014-08-10 | Discharge: 2014-08-10 | Disposition: A | Discharge status: Home / Self Care | Payer: Self-pay | Attending: Emergency Medicine | Admitting: Emergency Medicine

## 2014-08-10 ENCOUNTER — Emergency Department (HOSPITAL_BASED_OUTPATIENT_CLINIC_OR_DEPARTMENT_OTHER): Payer: Self-pay

## 2014-08-10 ENCOUNTER — Encounter (HOSPITAL_BASED_OUTPATIENT_CLINIC_OR_DEPARTMENT_OTHER): Payer: Self-pay | Admitting: Emergency Medicine

## 2014-08-10 DIAGNOSIS — S62609S Fracture of unspecified phalanx of unspecified finger, sequela: Secondary | ICD-10-CM

## 2014-08-10 DIAGNOSIS — Z729 Problem related to lifestyle, unspecified: Secondary | ICD-10-CM | POA: Insufficient documentation

## 2014-08-10 DIAGNOSIS — S62632S Displaced fracture of distal phalanx of right middle finger, sequela: Secondary | ICD-10-CM | POA: Insufficient documentation

## 2014-08-10 DIAGNOSIS — Z79899 Other long term (current) drug therapy: Secondary | ICD-10-CM | POA: Insufficient documentation

## 2014-08-10 DIAGNOSIS — Q899 Congenital malformation, unspecified: Secondary | ICD-10-CM

## 2014-08-10 DIAGNOSIS — M21241 Flexion deformity, right finger joints: Secondary | ICD-10-CM | POA: Insufficient documentation

## 2014-08-10 MED ORDER — IBUPROFEN 800 MG PO TABS: 800.0000 mg | ORAL_TABLET | Freq: Three times a day (TID) | ORAL | Status: DC

## 2014-08-10 NOTE — ED Provider Notes (Signed)
Medical screening examination/treatment/procedure(s) were performed by non-physician practitioner and as supervising physician I was immediately available for consultation/collaboration.   EKG Interpretation None        Joya Gaskinsonald W Christerpher Clos, MD 08/10/14 [phone]
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
## 2014-08-10 NOTE — ED Notes (Signed)
There is no ring on the right ring finger.
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
## 2014-08-10 NOTE — Discharge Instructions (Signed)

## 2014-08-13 ENCOUNTER — Encounter (HOSPITAL_BASED_OUTPATIENT_CLINIC_OR_DEPARTMENT_OTHER): Payer: Self-pay | Admitting: Emergency Medicine

## 2015-01-23 ENCOUNTER — Encounter (HOSPITAL_COMMUNITY): Payer: Self-pay | Admitting: *Deleted

## 2015-01-23 ENCOUNTER — Emergency Department (HOSPITAL_COMMUNITY)
Admission: EM | Admit: 2015-01-23 | Discharge: 2015-01-23 | Disposition: A | Discharge status: Home / Self Care | Payer: Self-pay | Attending: Emergency Medicine | Admitting: Emergency Medicine

## 2015-01-23 ENCOUNTER — Emergency Department (HOSPITAL_COMMUNITY): Payer: Self-pay

## 2015-01-23 DIAGNOSIS — Z720 Tobacco use: Secondary | ICD-10-CM | POA: Insufficient documentation

## 2015-01-23 DIAGNOSIS — Z79899 Other long term (current) drug therapy: Secondary | ICD-10-CM | POA: Insufficient documentation

## 2015-01-23 DIAGNOSIS — M25512 Pain in left shoulder: Secondary | ICD-10-CM | POA: Insufficient documentation

## 2015-01-23 MED ORDER — TRAMADOL HCL 50 MG PO TABS
50.0000 mg | ORAL_TABLET | Freq: Once | ORAL | Status: AC
  Administered 2015-01-23: 50 mg via ORAL
  Filled 2015-01-23: qty 1

## 2015-01-23 MED ORDER — CYCLOBENZAPRINE HCL 5 MG PO TABS: 5.0000 mg | ORAL_TABLET | Freq: Three times a day (TID) | ORAL | Status: DC | PRN

## 2015-01-23 MED ORDER — TRAMADOL HCL 50 MG PO TABS: 50.0000 mg | ORAL_TABLET | Freq: Four times a day (QID) | ORAL | Status: DC | PRN

## 2015-01-23 MED ORDER — CYCLOBENZAPRINE HCL 10 MG PO TABS
10.0000 mg | ORAL_TABLET | Freq: Once | ORAL | Status: AC
  Administered 2015-01-23: 10 mg via ORAL
  Filled 2015-01-23: qty 1

## 2015-01-23 NOTE — ED Notes (Signed)
Pain lt shoulder for 4 days, worse today.Increase pain with movement

## 2015-01-23 NOTE — ED Notes (Signed)
Patient transported to X-ray 

## 2015-01-23 NOTE — ED Provider Notes (Signed)
CSN: [id number]     Arrival date & time 01/23/15  1658 History   First MD Initiated Contact with Patient 01/23/15 1710     Chief Complaint  Patient presents with  . Shoulder Pain     (Consider location/radiation/quality/duration/timing/severity/associated sxs/prior Treatment) The history is provided by the patient.   [name] is a 49 y.o. female presenting with a 4 day history of left shoulder pain.  She denies injury, states she simply woke with pain which has worsened over the past 4 days.  She denies injury, but endorses she sleeps on her left side.  She has had no increased activity, reports moved to a new home and lifted boxes, but this occurred early February.  She denies chest pain and shortness of breath.  Her pain is "around" her left shoulder joint, including into her axilla.  Described as aching in quality, constant and worsened with movement and range of motion, especially with attempts to raise the left arm over shoulder height.  There is no radiation of pain.  She has taken ibuprofen and tylenol without relief.     History reviewed. No pertinent past medical history. Past Surgical History  Procedure Laterality Date  . Tubal ligation    . Tonsillectomy     Family History  Problem Relation Age of Onset  . Diabetes Mother   . Hypertension Mother   . Cancer Mother     breast  . COPD Father   . Diabetes Maternal Grandmother   . Hypertension Maternal Grandmother   . Stroke Maternal Grandmother   . Heart disease Maternal Grandmother    History  Substance Use Topics  . Smoking status: Current Every Day Smoker -- 1.00 packs/day for 31 years    Types: Cigarettes  . Smokeless tobacco: Never Used  . Alcohol Use: No     Comment: beer 3 x week   OB History    Gravida Para Term Preterm AB TAB SAB Ectopic Multiple Living   Review of Systems  Constitutional: Negative for fever.  Musculoskeletal: Positive for joint swelling and arthralgias.  Negative for myalgias.  Neurological: Negative for weakness and numbness.      Allergies  Review of patient's allergies indicates no known allergies.  Home Medications   Prior to Admission medications   Medication Sig Start Date End Date Taking? Authorizing Provider  acetaminophen (TYLENOL) 500 MG tablet Take 500 mg by mouth every 6 (six) hours as needed for mild pain or moderate pain.   Yes Historical Provider, MD  ibuprofen (ADVIL,MOTRIN) 200 MG tablet Take 800 mg by mouth every 6 (six) hours as needed for mild pain or moderate pain.   Yes Historical Provider, MD  albuterol (PROVENTIL HFA;VENTOLIN HFA) 108 (90 BASE) MCG/ACT inhaler Inhale 1-2 puffs into the lungs every 6 (six) hours as needed for wheezing. Patient not taking: Reported on 01/23/2015 11/04/11 11/03/12  Nelva Nay, MD  cyclobenzaprine (FLEXERIL) 5 MG tablet Take 1 tablet (5 mg total) by mouth 3 (three) times daily as needed for muscle spasms. 01/23/15   Burgess Amor, PA-C  HYDROcodone-acetaminophen (NORCO/VICODIN) 5-325 MG per tablet Take 2 tablets by mouth every 4 (four) hours as needed for moderate pain. Patient not taking: Reported on 01/23/2015 07/22/14   Marisa Severin, MD  ibuprofen (ADVIL,MOTRIN) 800 MG tablet Take 1 tablet (800 mg total) by mouth 3 (three) times daily. Patient not taking: Reported on 01/23/2015 08/10/14  Shari Upstill, PA-C  ondansetron (ZOFRAN-ODT) 4 MG disintegrating tablet Take 1 tablet (4 mg total) by mouth every 8 (eight) hours as needed for nausea or vomiting. Patient not taking: Reported on 01/23/2015 07/22/14   Marisa Severinlga Otter, MD  traMADol (ULTRAM) 50 MG tablet Take 1 tablet (50 mg total) by mouth every 6 (six) hours as needed. 01/23/15   Burgess AmorJulie Colonel Krauser, PA-C   BP 125/88 mmHg  Pulse 55  Temp(Src) 97.5 F (36.4 C) (Oral)  Resp 20  Ht 5\' 5"  (1.651 m)  Wt 139 lb (63.05 kg)  BMI 23.13 kg/m2  SpO2 98%  LMP 11/02/2011 Physical Exam  Constitutional: She appears well-developed and well-nourished.  HENT:   Head: Atraumatic.  Neck: Normal range of motion.  Cardiovascular:  Pulses equal bilaterally  Musculoskeletal: She exhibits tenderness. She exhibits no edema.       Left shoulder: She exhibits bony tenderness. She exhibits no swelling, no effusion, no deformity, no spasm and normal pulse.  ttp anterior left humeral head and along lateral pectoralis musculature.   Neurological: She is alert. She has normal strength. She displays normal reflexes. No sensory deficit.  Skin: Skin is warm and dry. No bruising and no lesion noted. No erythema.  Psychiatric: She has a normal mood and affect.    ED Course  Procedures (including critical care time) Labs Review Labs Reviewed - No data to display  Imaging Review Dg Shoulder Left  01/23/2015   CLINICAL DATA:  Shoulder pain for 5 days.  No known injury.  EXAM: LEFT SHOULDER - 2+ VIEW  COMPARISON:  None.  FINDINGS: There is no evidence of fracture or dislocation. There is no evidence of arthropathy or other focal bone abnormality. Soft tissues are unremarkable.  IMPRESSION: Negative.   Electronically Signed   By: Signa Kellaylor  Stroud M.D.   On: 01/23/2015 18:56     EKG Interpretation None      MDM   Final diagnoses:  Shoulder pain, acute, left    Suspect muscle spasm/strain. Pt concern regarding possible rotator cuff injury, but history does not suggest this. She was referred to ortho for further eval.  Ice/heat.  Tramadol, flexeril.  No cp, no sob, pain is reproducible with palpation and movement of the left shoulder joint.  Patients labs and/or radiological studies were reviewed and considered during the medical decision making and disposition process.  Results were also discussed with patient.     Burgess AmorJulie Jericka Kadar, PA-C 01/24/15 1333  Mancel BaleElliott Wentz, MD 01/24/15 [phone]

## 2015-01-23 NOTE — Discharge Instructions (Signed)

## 2015-06-25 ENCOUNTER — Emergency Department (HOSPITAL_COMMUNITY)
Admission: EM | Admit: 2015-06-25 | Discharge: 2015-06-25 | Disposition: A | Discharge status: Home / Self Care | Payer: Self-pay | Attending: Emergency Medicine | Admitting: Emergency Medicine

## 2015-06-25 ENCOUNTER — Encounter (HOSPITAL_COMMUNITY): Payer: Self-pay | Admitting: Emergency Medicine

## 2015-06-25 ENCOUNTER — Emergency Department (HOSPITAL_COMMUNITY): Payer: Self-pay

## 2015-06-25 DIAGNOSIS — Z720 Tobacco use: Secondary | ICD-10-CM | POA: Insufficient documentation

## 2015-06-25 DIAGNOSIS — Y9289 Other specified places as the place of occurrence of the external cause: Secondary | ICD-10-CM | POA: Insufficient documentation

## 2015-06-25 DIAGNOSIS — Y998 Other external cause status: Secondary | ICD-10-CM | POA: Insufficient documentation

## 2015-06-25 DIAGNOSIS — S73102A Unspecified sprain of left hip, initial encounter: Secondary | ICD-10-CM | POA: Insufficient documentation

## 2015-06-25 DIAGNOSIS — X58XXXA Exposure to other specified factors, initial encounter: Secondary | ICD-10-CM | POA: Insufficient documentation

## 2015-06-25 DIAGNOSIS — Y9389 Activity, other specified: Secondary | ICD-10-CM | POA: Insufficient documentation

## 2015-06-25 MED ORDER — TRAMADOL HCL 50 MG PO TABS
50.0000 mg | ORAL_TABLET | Freq: Once | ORAL | Status: AC
  Administered 2015-06-25: 50 mg via ORAL
  Filled 2015-06-25: qty 1

## 2015-06-25 MED ORDER — HYDROCODONE-ACETAMINOPHEN 5-325 MG PO TABS: ORAL_TABLET | ORAL | Status: DC

## 2015-06-25 MED ORDER — DICLOFENAC SODIUM 75 MG PO TBEC: 75.0000 mg | DELAYED_RELEASE_TABLET | Freq: Two times a day (BID) | ORAL | Status: DC

## 2015-06-25 NOTE — Discharge Instructions (Signed)
Ligament Sprain °A ligament sprain is when the bands of tissue that hold bones together (ligament) are stretched. °HOME CARE  °· Rest the injured area. °· Start using the joint when told to by your doctor. °· Keep the injured area raised (elevated) above the level of the heart. This may lessen puffiness (swelling). °· Put ice on the injured area. °¨ Put ice in a plastic bag. °¨ Place a towel between your skin and the bag. °¨ Leave the ice on for 15-20 minutes, 03-04 times a day. °· Wear a splint, cast, or an elastic bandage as told by your doctor. °· Only take medicine as told by your doctor. °· Use crutches as told by your doctor. Do not put weight on the injured joint until told to by your doctor. °GET HELP RIGHT AWAY IF:  °· You have more bruising, puffiness, or pain. °· The leg was injured and the toes are cold, tingling, numb, or blue. °· The arm was injured and the fingers are cold, tingling, numb, or blue. °· The pain is not helped with medicine. °· The pain gets worse. °MAKE SURE YOU:  °· Understand these instructions. °· Will watch this condition. °· Will get help right away if you are not doing well or get worse. °Document Released: 03/16/2008 Document Revised: 07/19/2013 Document Reviewed: 03/16/2008 °[employer]® Patient Information ©2015 [employer]. This information is not intended to replace advice given to you by your health care provider. Make sure you discuss any questions you have with your health care provider. ° °

## 2015-06-25 NOTE — ED Notes (Signed)
Having pain to left groin area for last 7 days.  Rates pain 10/10.  Denies injury.  Have not taken any pain medication today.

## 2015-06-25 NOTE — ED Notes (Signed)
Patient given discharge instruction, verbalized understand. Patient ambulatory out of the department.  

## 2015-06-25 NOTE — ED Provider Notes (Signed)
CSN: [id number]     Arrival date & time 06/25/15  1509 History   First MD Initiated Contact with Patient 06/25/15 1606     Chief Complaint  Patient presents with  . Groin Pain     (Consider location/radiation/quality/duration/timing/severity/associated sxs/prior Treatment) HPI   [name] is a 49 y.o. female who presents to the Emergency Department complaining of left hip pain for one week.  She states the pain is sharp and gradually improves through the day.  She denies injury but does admit to working on heavy equipment and performs frequent lifting and twisting.  She states the pain is associated with movement and improves with rest.  She has been taking aleve with minimal relief.  She denies pain, numbness or weakness of the leg, back pain, pelvic pain, vaginal bleeding or discharge, urinary or bowel changes.     History reviewed. No pertinent past medical history. Past Surgical History  Procedure Laterality Date  . Tubal ligation    . Tonsillectomy     Family History  Problem Relation Age of Onset  . Diabetes Mother   . Hypertension Mother   . Cancer Mother     breast  . COPD Father   . Diabetes Maternal Grandmother   . Hypertension Maternal Grandmother   . Stroke Maternal Grandmother   . Heart disease Maternal Grandmother    Social History  Substance Use Topics  . Smoking status: Current Every Day Smoker -- 1.00 packs/day for 31 years    Types: Cigarettes  . Smokeless tobacco: Never Used  . Alcohol Use: No     Comment: beer 3 x week   OB History    Gravida Para Term Preterm AB TAB SAB Ectopic Multiple Living   Review of Systems  Constitutional: Negative for fever and chills.  Respiratory: Negative for shortness of breath.   Cardiovascular: Negative for chest pain.  Gastrointestinal: Negative for nausea, vomiting and abdominal pain.  Genitourinary: Negative for dysuria, flank pain, vaginal bleeding, difficulty urinating, vaginal pain  and pelvic pain.  Musculoskeletal: Positive for arthralgias (left groin pain). Negative for joint swelling.  Skin: Negative for color change and wound.  Neurological: Negative for weakness and numbness.  All other systems reviewed and are negative.     Allergies  Review of patient's allergies indicates no known allergies.  Home Medications   Prior to Admission medications   Not on File   BP 120/62 mmHg  Pulse 62  Temp(Src) 98 F (36.7 C) (Oral)  Resp 18  Ht  (1.676 m)  Wt 142 lb (64.411 kg)  BMI 22.93 kg/m2  SpO2 100%  LMP 11/02/2011 Physical Exam  Constitutional: She is oriented to person, place, and time. She appears well-developed and well-nourished. No distress.  HENT:  Head: Normocephalic and atraumatic.  Mouth/Throat: Oropharynx is clear and moist.  Cardiovascular: Normal rate, regular rhythm and intact distal pulses.   Pulmonary/Chest: Effort normal and breath sounds normal. No respiratory distress.  Abdominal: Soft. She exhibits no distension. There is no tenderness. There is no rebound and no guarding.  Musculoskeletal: Normal range of motion. She exhibits no edema.  Focal tenderness of the left anterior hip.  Pain reproduced with SLR, internal and external rotation of the hip.  No edema, excessive warmth or erythema of the joint.  DP pulses brisk, distal sensation intact. No spinal tenderness  Neurological: She is alert and oriented to person, place,  and time. Coordination normal.  Skin: Skin is warm and dry.  Nursing note and vitals reviewed.   ED Course  Procedures (including critical care time) Labs Review Labs Reviewed - No data to display  Imaging Review Dg Hip Unilat With Pelvis 2-3 Views Left  06/25/2015   CLINICAL DATA:  Left groin pain 7 days.  No injury.  EXAM: DG HIP (WITH OR WITHOUT PELVIS) 2-3V LEFT  COMPARISON:  None.  FINDINGS: There is mild diffuse decreased bone mineralization. No evidence of acute fracture or dislocation. Minimal  degenerative change of the spine is present.  IMPRESSION: No acute findings.   Electronically Signed   By: Elberta Fortis M.D.   On: 06/25/2015 16:46   I have personally reviewed and evaluated these images and lab results as part of my medical decision-making.   EKG Interpretation None      MDM   Final diagnoses:  Hip sprain, left, initial encounter   Pt is well appearing, vitals stable.     Pt with focal tenderness of the left anterior hip.  No erythema, excessive warmth or edema of the joint.  Pain reproduced with rotation of the hip.  No concerning sx's for septic joint.  Likely to be sprain.  Pt is ambulatory with a steady gait.  No focal neuro deficits.  She agrees to symptomatic tx, rest and close orthopedic referral if pain is not improving.  Also advised to return here if needed.  Appears stable for d/c    Pauline Aus, PA-C 06/26/15 0030  Rolland Porter, MD 06/29/15 [phone]
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
## 2017-07-23 ENCOUNTER — Emergency Department (HOSPITAL_COMMUNITY): Payer: Self-pay

## 2017-07-23 ENCOUNTER — Emergency Department (HOSPITAL_COMMUNITY)
Admission: EM | Admit: 2017-07-23 | Discharge: 2017-07-23 | Disposition: A | Discharge status: Home / Self Care | Payer: Self-pay | Attending: Emergency Medicine | Admitting: Emergency Medicine

## 2017-07-23 ENCOUNTER — Encounter (HOSPITAL_COMMUNITY): Payer: Self-pay | Admitting: Emergency Medicine

## 2017-07-23 DIAGNOSIS — Y999 Unspecified external cause status: Secondary | ICD-10-CM | POA: Insufficient documentation

## 2017-07-23 DIAGNOSIS — Y9302 Activity, running: Secondary | ICD-10-CM | POA: Insufficient documentation

## 2017-07-23 DIAGNOSIS — F17210 Nicotine dependence, cigarettes, uncomplicated: Secondary | ICD-10-CM | POA: Insufficient documentation

## 2017-07-23 DIAGNOSIS — T148XXA Other injury of unspecified body region, initial encounter: Secondary | ICD-10-CM

## 2017-07-23 DIAGNOSIS — Z79899 Other long term (current) drug therapy: Secondary | ICD-10-CM | POA: Insufficient documentation

## 2017-07-23 DIAGNOSIS — S8002XA Contusion of left knee, initial encounter: Secondary | ICD-10-CM | POA: Insufficient documentation

## 2017-07-23 DIAGNOSIS — M25462 Effusion, left knee: Secondary | ICD-10-CM | POA: Insufficient documentation

## 2017-07-23 DIAGNOSIS — Y92007 Garden or yard of unspecified non-institutional (private) residence as the place of occurrence of the external cause: Secondary | ICD-10-CM | POA: Insufficient documentation

## 2017-07-23 DIAGNOSIS — W010XXA Fall on same level from slipping, tripping and stumbling without subsequent striking against object, initial encounter: Secondary | ICD-10-CM | POA: Insufficient documentation

## 2017-07-23 MED ORDER — IBUPROFEN 600 MG PO TABS: 600.0000 mg | ORAL_TABLET | Freq: Four times a day (QID) | ORAL | 0 refills | Status: DC | PRN

## 2017-07-23 MED ORDER — OXYCODONE-ACETAMINOPHEN 5-325 MG PO TABS
1.0000 | ORAL_TABLET | Freq: Once | ORAL | Status: AC
  Administered 2017-07-23: 1 via ORAL
  Filled 2017-07-23: qty 1

## 2017-07-23 MED ORDER — ONDANSETRON 4 MG PO TBDP
4.0000 mg | ORAL_TABLET | Freq: Once | ORAL | Status: AC
  Administered 2017-07-23: 4 mg via ORAL
  Filled 2017-07-23: qty 1

## 2017-07-23 MED ORDER — OXYCODONE-ACETAMINOPHEN 5-325 MG PO TABS: 1.0000 | ORAL_TABLET | ORAL | 0 refills | Status: DC | PRN

## 2017-07-23 NOTE — Discharge Instructions (Signed)
Rest your knee as much as possible and use ice and elevation to help with pain and swelling.  Wear the ace wrap and use the crutches until you are more comfortable with weight bearing. Call Dr. Romeo Apple if this does not heal as expected.  You may take the oxycodone prescribed for pain relief.  This will make you drowsy - do not drive within 4 hours of taking this medication.  [facility]  [address] [place], [place] [address] [phone]  Services [facility] offers a variety of basic health services.  Services include but are not limited to: Blood pressure checks  Heart rate checks  Blood sugar checks  Urine analysis  Rapid strep tests  Pregnancy tests.  Health education and referrals  People needing more complex services will be directed to a physician online. Using these virtual visits, doctors can evaluate and prescribe medicine and treatments. There will be no medication on-site, though [facility] will help patients fill their prescriptions at little to no cost.   For More information please go to: [email or web address]

## 2017-07-23 NOTE — ED Triage Notes (Signed)
Pt c/o left knee pain since tripping and falling over dog last night.
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
## 2018-07-10 ENCOUNTER — Emergency Department (HOSPITAL_COMMUNITY): Payer: Self-pay

## 2018-07-10 ENCOUNTER — Encounter (HOSPITAL_COMMUNITY): Payer: Self-pay | Admitting: Emergency Medicine

## 2018-07-10 ENCOUNTER — Other Ambulatory Visit: Payer: Self-pay

## 2018-07-10 ENCOUNTER — Emergency Department (HOSPITAL_COMMUNITY)
Admission: EM | Admit: 2018-07-10 | Discharge: 2018-07-10 | Disposition: A | Discharge status: Home / Self Care | Payer: Self-pay | Attending: Emergency Medicine | Admitting: Emergency Medicine

## 2018-07-10 DIAGNOSIS — F17210 Nicotine dependence, cigarettes, uncomplicated: Secondary | ICD-10-CM | POA: Insufficient documentation

## 2018-07-10 DIAGNOSIS — S2231XA Fracture of one rib, right side, initial encounter for closed fracture: Secondary | ICD-10-CM | POA: Insufficient documentation

## 2018-07-10 DIAGNOSIS — W0110XA Fall on same level from slipping, tripping and stumbling with subsequent striking against unspecified object, initial encounter: Secondary | ICD-10-CM | POA: Insufficient documentation

## 2018-07-10 DIAGNOSIS — Y9389 Activity, other specified: Secondary | ICD-10-CM | POA: Insufficient documentation

## 2018-07-10 DIAGNOSIS — Y998 Other external cause status: Secondary | ICD-10-CM | POA: Insufficient documentation

## 2018-07-10 DIAGNOSIS — Z79899 Other long term (current) drug therapy: Secondary | ICD-10-CM | POA: Insufficient documentation

## 2018-07-10 DIAGNOSIS — Y929 Unspecified place or not applicable: Secondary | ICD-10-CM | POA: Insufficient documentation

## 2018-07-10 MED ORDER — TRAMADOL HCL 50 MG PO TABS: 50.0000 mg | ORAL_TABLET | Freq: Four times a day (QID) | ORAL | 0 refills | Status: DC | PRN

## 2018-07-10 MED ORDER — ONDANSETRON 8 MG PO TBDP
8.0000 mg | ORAL_TABLET | Freq: Once | ORAL | Status: AC
  Administered 2018-07-10: 8 mg via ORAL
  Filled 2018-07-10: qty 1

## 2018-07-10 MED ORDER — MORPHINE SULFATE (PF) 4 MG/ML IV SOLN
4.0000 mg | Freq: Once | INTRAVENOUS | Status: AC
  Administered 2018-07-10: 4 mg via INTRAMUSCULAR
  Filled 2018-07-10: qty 1

## 2018-07-10 NOTE — ED Triage Notes (Signed)
Pt states she fell last week on Wed chasing her dog, fell to ground (grass) on right side of body, denies LOC or hitting head, pt c/o right flank pain

## 2018-07-10 NOTE — ED Notes (Signed)
Patient transported to X-ray 

## 2018-07-10 NOTE — Discharge Instructions (Addendum)
Use the spirometer as directed.  You may feel more comfortable sleeping in a recliner for the first several days.  It is important that you cough and take deep breaths throughout the day.  You may also take ibuprofen every 6 hours if needed for pain.  Follow-up with your primary care provider or return to the ER for any worsening symptoms such as sudden increase in pain or shortness of breath.

## 2018-07-11 NOTE — ED Provider Notes (Signed)
[facility] EMERGENCY DEPARTMENT Provider Note   CSN: [id number] Arrival date & time: 07/10/18  4098     History   Chief Complaint Chief Complaint  Patient presents with  . Flank Pain    HPI [name] is a 52 y.o. female.  HPI   [name] is a 52 y.o. female who presents to the Emergency Department complaining of right lateral chest and flank pain.  Symptoms present for 4 days.  Describes the pain as worsening with movement, deep breathing and cough or sneezing.  Pain improves at rest.  Symptoms began after a mechanical fall that occurred when she tripped over her dog.  She has tried ice and tylenol without relief.  She denies shortness of breath, abdominal pain, dysuria, hematuria, fever or vomiting.    History reviewed. No pertinent past medical history.  Patient Active Problem List   Diagnosis Date Noted  . Breast lump on left side at 3 o'clock position 12/13/2012  . Pain in right axilla 12/13/2012    Past Surgical History:  Procedure Laterality Date  . TONSILLECTOMY    . TUBAL LIGATION       OB History    Gravida  4   Para  2   Term  2   Preterm      AB  2   Living  2     SAB  2   TAB      Ectopic      Multiple      Live Births               Home Medications    Prior to Admission medications   Medication Sig Start Date End Date Taking? Authorizing Provider  diclofenac (VOLTAREN) 75 MG EC tablet Take 1 tablet (75 mg total) by mouth 2 (two) times daily. Take with food 06/25/15   Jowanda Heeg, PA-C  HYDROcodone-acetaminophen (NORCO/VICODIN) 5-325 MG per tablet Take one-two tabs po q 4-6 hrs prn pain 06/25/15   Jeniyah Menor, PA-C  ibuprofen (ADVIL,MOTRIN) 600 MG tablet Take 1 tablet (600 mg total) by mouth every 6 (six) hours as needed. 07/23/17   Burgess Amor, PA-C  oxyCODONE-acetaminophen (PERCOCET/ROXICET) 5-325 MG tablet Take 1 tablet by mouth every 4 (four) hours as needed. 07/23/17   Burgess Amor, PA-C  traMADol  (ULTRAM) 50 MG tablet Take 1 tablet (50 mg total) by mouth every 6 (six) hours as needed. 07/10/18   Pauline Aus, PA-C    Family History Family History  Problem Relation Age of Onset  . Diabetes Mother   . Hypertension Mother   . Cancer Mother        breast  . COPD Father   . Diabetes Maternal Grandmother   . Hypertension Maternal Grandmother   . Stroke Maternal Grandmother   . Heart disease Maternal Grandmother     Social History Social History   Tobacco Use  . Smoking status: Current Every Day Smoker    Packs/day: 1.00    Years: 31.00    Pack years: 31.00    Types: Cigarettes  . Smokeless tobacco: Never Used  Substance Use Topics  . Alcohol use: No    Comment: beer 3 x week  . Drug use: No     Allergies   Vicodin [hydrocodone-acetaminophen]   Review of Systems Review of Systems  Constitutional: Negative for chills and fever.  Respiratory: Negative for cough, shortness of breath and wheezing.   Cardiovascular: Positive for chest pain (right chest wall  pain).  Gastrointestinal: Negative for abdominal pain, nausea and vomiting.  Genitourinary: Positive for flank pain (right flank pain). Negative for difficulty urinating, dysuria and hematuria.  Musculoskeletal: Negative for arthralgias, back pain, joint swelling and neck pain.  Skin: Negative for color change and wound.  Neurological: Negative for dizziness, syncope, weakness, numbness and headaches.  Psychiatric/Behavioral: Negative for confusion.  All other systems reviewed and are negative.    Physical Exam Updated Vital Signs BP 123/82 (BP Location: Right Arm)   Pulse 92   Temp 98.3 F (36.8 C) (Oral)   Resp 18   Ht 5\' 5"  (1.651 m)   Wt 50.8 kg   LMP 11/01/2013   SpO2 96%   BMI 18.64 kg/m   Physical Exam  Constitutional: She appears well-nourished. No distress.  Uncomfortable appearing  HENT:  Mouth/Throat: Oropharynx is clear and moist.  Neck: Normal range of motion.  Cardiovascular:  Normal rate and regular rhythm.  Pulmonary/Chest: Effort normal and breath sounds normal. No respiratory distress. She exhibits tenderness (focal ttp of the right lateral ribs.  pt is guarding the area.  no edema or crepitus).  Abdominal: Soft. She exhibits no distension. There is no tenderness.  Musculoskeletal: Normal range of motion. She exhibits no edema.  Neurological: She is alert. No sensory deficit.  Skin: Skin is warm. Capillary refill takes less than 2 seconds. No rash noted.  Nursing note and vitals reviewed.    ED Treatments / Results  Labs (all labs ordered are listed, but only abnormal results are displayed) Labs Reviewed - No data to display  EKG None  Radiology Dg Ribs Unilateral W/chest Right  Result Date: 07/10/2018 CLINICAL DATA:  Right rib pain, fall EXAM: RIGHT RIBS AND CHEST - 3+ VIEW COMPARISON:  CT chest dated 12/03/2011 FINDINGS: Lungs are clear.  No pleural effusion or pneumothorax. The heart is normal in size. Nondisplaced right anterolateral 9th rib fracture. IMPRESSION: Nondisplaced right anterolateral rib fracture. No pneumothorax. Electronically Signed   By: Charline Bills M.D.   On: 07/10/2018 21:06    Procedures Procedures (including critical care time)  Medications Ordered in ED Medications  morphine 4 MG/ML injection 4 mg (4 mg Intramuscular Given 07/10/18 2047)  ondansetron (ZOFRAN-ODT) disintegrating tablet 8 mg (8 mg Oral Given 07/10/18 2048)     Initial Impression / Assessment and Plan / ED Course  I have reviewed the triage vital signs and the nursing notes.  Pertinent labs & imaging results that were available during my care of the patient were reviewed by me and considered in my medical decision making (see chart for details).    XR finding discussed with pt.  She reports feeling better after medication.  Vitals reassuring.  Incentive spirometer dispensed with use instructions provided.  Pt agrees to close PCP f/u, return precautions  discussed.  Pt ambulatory with steady gait, no distress noted.  Final Clinical Impressions(s) / ED Diagnoses   Final diagnoses:  Closed fracture of one rib of right side, initial encounter    ED Discharge Orders         Ordered    traMADol (ULTRAM) 50 MG tablet  Every 6 hours PRN     07/10/18 2126           Pauline Aus, PA-C 07/11/18 1745    Raeford Razor, MD 07/11/18 2323

## 2019-07-31 ENCOUNTER — Other Ambulatory Visit: Payer: Self-pay

## 2019-07-31 ENCOUNTER — Emergency Department (HOSPITAL_BASED_OUTPATIENT_CLINIC_OR_DEPARTMENT_OTHER)
Admission: EM | Admit: 2019-07-31 | Discharge: 2019-07-31 | Disposition: A | Discharge status: Home / Self Care | Payer: Self-pay | Attending: Emergency Medicine | Admitting: Emergency Medicine

## 2019-07-31 ENCOUNTER — Encounter (HOSPITAL_BASED_OUTPATIENT_CLINIC_OR_DEPARTMENT_OTHER): Payer: Self-pay | Admitting: *Deleted

## 2019-07-31 ENCOUNTER — Emergency Department (HOSPITAL_BASED_OUTPATIENT_CLINIC_OR_DEPARTMENT_OTHER): Payer: Self-pay

## 2019-07-31 DIAGNOSIS — R0789 Other chest pain: Secondary | ICD-10-CM | POA: Insufficient documentation

## 2019-07-31 DIAGNOSIS — F17210 Nicotine dependence, cigarettes, uncomplicated: Secondary | ICD-10-CM | POA: Insufficient documentation

## 2019-07-31 DIAGNOSIS — F1210 Cannabis abuse, uncomplicated: Secondary | ICD-10-CM | POA: Insufficient documentation

## 2019-07-31 LAB — COMPREHENSIVE METABOLIC PANEL
ALT: 33 U/L (ref 0–44)
AST: 32 U/L (ref 15–41)
Albumin: 3.3 g/dL — ABNORMAL LOW (ref 3.5–5.0)
Alkaline Phosphatase: 68 U/L (ref 38–126)
Anion gap: 11 (ref 5–15)
BUN: 21 mg/dL — ABNORMAL HIGH (ref 6–20)
CO2: 22 mmol/L (ref 22–32)
Calcium: 8.2 mg/dL — ABNORMAL LOW (ref 8.9–10.3)
Chloride: 105 mmol/L (ref 98–111)
Creatinine, Ser: 1.04 mg/dL — ABNORMAL HIGH (ref 0.44–1.00)
GFR calc Af Amer: >60 mL/min (ref >60)
GFR calc non Af Amer: >60 mL/min (ref >60)
Glucose, Bld: 102 mg/dL — ABNORMAL HIGH (ref 70–99)
Potassium: 3.7 mmol/L (ref 3.5–5.1)
Sodium: 138 mmol/L (ref 135–145)
Total Bilirubin: 0.4 mg/dL (ref 0.3–1.2)
Total Protein: 5.5 g/dL — ABNORMAL LOW (ref 6.5–8.1)

## 2019-07-31 LAB — CBC WITH DIFFERENTIAL/PLATELET
Abs Immature Granulocytes: 0.02 10*3/uL (ref 0.00–0.07)
Basophils Absolute: 0 10*3/uL (ref 0.0–0.1)
Basophils Relative: 0 %
Eosinophils Absolute: 0.1 10*3/uL (ref 0.0–0.5)
Eosinophils Relative: 1 %
HCT: 41.3 % (ref 36.0–46.0)
Hemoglobin: 13.8 g/dL (ref 12.0–15.0)
Immature Granulocytes: 0 %
Lymphocytes Relative: 24 %
Lymphs Abs: 2.4 10*3/uL (ref 0.7–4.0)
MCH: 33.8 pg (ref 26.0–34.0)
MCHC: 33.4 g/dL (ref 30.0–36.0)
MCV: 101.2 fL — ABNORMAL HIGH (ref 80.0–100.0)
Monocytes Absolute: 0.7 10*3/uL (ref 0.1–1.0)
Monocytes Relative: 7 %
Neutro Abs: 6.7 10*3/uL (ref 1.7–7.7)
Neutrophils Relative %: 68 %
Platelets: 248 10*3/uL (ref 150–400)
RBC: 4.08 MIL/uL (ref 3.87–5.11)
RDW: 11.7 % (ref 11.5–15.5)
WBC: 9.9 10*3/uL (ref 4.0–10.5)
nRBC: 0 % (ref 0.0–0.2)

## 2019-07-31 LAB — LIPASE, BLOOD: Lipase: 30 U/L (ref 11–51)

## 2019-07-31 LAB — TROPONIN I (HIGH SENSITIVITY)
Troponin I (High Sensitivity): 5 ng/L (ref <18)
Troponin I (High Sensitivity): 6 ng/L (ref <18)

## 2019-07-31 NOTE — ED Notes (Signed)
Patient transported to X-ray 

## 2019-07-31 NOTE — ED Notes (Signed)
ED Provider at bedside. 

## 2019-07-31 NOTE — Discharge Instructions (Signed)
Continue your home medications as previously prescribed. Return to the ED if you start to have worsening symptoms, worsening shortness of breath or chest pain, leg swelling, vomiting or coughing up blood.

## 2019-07-31 NOTE — ED Triage Notes (Signed)
Epigastric pain since giving plasma yesterday.

## 2019-07-31 NOTE — ED Notes (Signed)
Pt reports donating plasma yesterday and developing a pin in her left chest and cramping in her arms.

## 2019-07-31 NOTE — ED Provider Notes (Signed)
[facility] EMERGENCY DEPARTMENT Provider Note   CSN: [id number] Arrival date & time: 07/31/19  1208     History   Chief Complaint Chief Complaint  Patient presents with  . Epigastric Pain    HPI [name] is a 53 y.o. female who presents to ED for evaluation of left-sided lower chest/epigastric pain for the past 24 hours.  States that she was donating plasma as she usually does twice a week when all of a sudden she began feeling she had left-sided chest pain.  She was almost completely done with her donation when this began.  Never had the symptoms in the past.  Also reports cramping sensation in her left arm.  Symptoms have been constant and described as aching.  She states that "I just want to get my heart checked out because my mom and other family members had heart problems."  She denies any cough, shortness of breath, vomiting, diarrhea, prior MI, DVT, PE, recent immobilization, leg swelling, fever.  She has not tried any medications to help with her symptoms.     HPI  History reviewed. No pertinent past medical history.  Patient Active Problem List   Diagnosis Date Noted  . Breast lump on left side at 3 o'clock position 12/13/2012  . Pain in right axilla 12/13/2012    Past Surgical History:  Procedure Laterality Date  . TONSILLECTOMY    . TUBAL LIGATION       OB History    Gravida  4   Para  2   Term  2   Preterm      AB  2   Living  2     SAB  2   TAB      Ectopic      Multiple      Live Births               Home Medications    Prior to Admission medications   Medication Sig Start Date End Date Taking? Authorizing Provider  diclofenac (VOLTAREN) 75 MG EC tablet Take 1 tablet (75 mg total) by mouth 2 (two) times daily. Take with food 06/25/15   Triplett, Tammy, PA-C  HYDROcodone-acetaminophen (NORCO/VICODIN) 5-325 MG per tablet Take one-two tabs po q 4-6 hrs prn pain 06/25/15   Triplett, Tammy, PA-C  ibuprofen  (ADVIL,MOTRIN) 600 MG tablet Take 1 tablet (600 mg total) by mouth every 6 (six) hours as needed. 07/23/17   Burgess Amor, PA-C  oxyCODONE-acetaminophen (PERCOCET/ROXICET) 5-325 MG tablet Take 1 tablet by mouth every 4 (four) hours as needed. 07/23/17   Burgess Amor, PA-C  traMADol (ULTRAM) 50 MG tablet Take 1 tablet (50 mg total) by mouth every 6 (six) hours as needed. 07/10/18   Pauline Aus, PA-C    Family History Family History  Problem Relation Age of Onset  . Diabetes Mother   . Hypertension Mother   . Cancer Mother        breast  . COPD Father   . Diabetes Maternal Grandmother   . Hypertension Maternal Grandmother   . Stroke Maternal Grandmother   . Heart disease Maternal Grandmother     Social History Social History   Tobacco Use  . Smoking status: Current Every Day Smoker    Packs/day: 1.00    Years: 31.00    Pack years: 31.00    Types: Cigarettes  . Smokeless tobacco: Never Used  Substance Use Topics  . Alcohol use: Yes    Comment: beer 3  x week  . Drug use: Yes    Types: Marijuana     Allergies   Vicodin [hydrocodone-acetaminophen]   Review of Systems Review of Systems  Constitutional: Negative for appetite change, chills and fever.  HENT: Negative for ear pain, rhinorrhea, sneezing and sore throat.   Eyes: Negative for photophobia and visual disturbance.  Respiratory: Negative for cough, chest tightness, shortness of breath and wheezing.   Cardiovascular: Positive for chest pain. Negative for palpitations.  Gastrointestinal: Negative for abdominal pain, blood in stool, constipation, diarrhea, nausea and vomiting.  Genitourinary: Negative for dysuria, hematuria and urgency.  Musculoskeletal: Positive for myalgias.  Skin: Negative for rash.  Neurological: Negative for dizziness, weakness and light-headedness.     Physical Exam Updated Vital Signs BP 108/77 (BP Location: Right Arm)   Pulse 84   Temp 97.6 F (36.4 C) (Oral)   Resp 18   Ht 5\' 5"   (1.651 m)   Wt 58.5 kg   LMP 11/01/2013   SpO2 96%   BMI 21.47 kg/m   Physical Exam Vitals signs and nursing note reviewed.  Constitutional:      General: She is not in acute distress.    Appearance: She is well-developed.  HENT:     Head: Normocephalic and atraumatic.     Nose: Nose normal.  Eyes:     General: No scleral icterus.       Left eye: No discharge.     Conjunctiva/sclera: Conjunctivae normal.  Neck:     Musculoskeletal: Normal range of motion and neck supple.  Cardiovascular:     Rate and Rhythm: Normal rate and regular rhythm.     Heart sounds: Normal heart sounds. No murmur. No friction rub. No gallop.   Pulmonary:     Effort: Pulmonary effort is normal. No respiratory distress.     Breath sounds: Normal breath sounds.  Chest:     Chest wall: Tenderness present.    Abdominal:     General: Bowel sounds are normal. There is no distension.     Palpations: Abdomen is soft.     Tenderness: There is no abdominal tenderness. There is no guarding.  Musculoskeletal: Normal range of motion.     Comments: TTP of L forearm without overlying skin changes.  2+ radial pulse palpated.  Areas neurovascularly intact.  Skin:    General: Skin is warm and dry.     Findings: No rash.  Neurological:     Mental Status: She is alert.     Motor: No abnormal muscle tone.     Coordination: Coordination normal.      ED Treatments / Results  Labs (all labs ordered are listed, but only abnormal results are displayed) Labs Reviewed  COMPREHENSIVE METABOLIC PANEL - Abnormal; Notable for the following components:      Result Value   Glucose, Bld 102 (*)    BUN 21 (*)    Creatinine, Ser 1.04 (*)    Calcium 8.2 (*)    Total Protein 5.5 (*)    Albumin 3.3 (*)    All other components within normal limits  CBC WITH DIFFERENTIAL/PLATELET - Abnormal; Notable for the following components:   MCV 101.2 (*)    All other components within normal limits  LIPASE, BLOOD  TROPONIN I  (HIGH SENSITIVITY)  TROPONIN I (HIGH SENSITIVITY)    EKG EKG Interpretation  Date/Time:  Monday July 31 2019 12:21:37 EDT Ventricular Rate:  89 PR Interval:  100 QRS Duration: 82 QT Interval:  362  QTC Calculation: 440 R Axis:   92 Text Interpretation:  Sinus rhythm with short PR Rightward axis Borderline ECG no change  Confirmed by Charlesetta Shanks [phone]) on 07/31/2019 1:10:48 PM   Radiology Dg Chest 2 View  Result Date: 07/31/2019 CLINICAL DATA:  Left-sided chest pain. EXAM: CHEST - 2 VIEW COMPARISON:  07/11/2018 FINDINGS: The cardiac silhouette, mediastinal and hilar contours are within normal limits and stable. A long stem a straight stable hyperinflation and mild emphysematous changes. No acute pulmonary findings. No infiltrates, edema or effusions. No worrisome pulmonary lesions. The bony thorax is intact. IMPRESSION: 1. Hyperinflation and emphysematous changes but no acute pulmonary findings. 2. No pulmonary lesions. Electronically Signed   By: Marijo Sanes M.D.   On: 07/31/2019 13:16    Procedures Procedures (including critical care time)  Medications Ordered in ED Medications - No data to display   Initial Impression / Assessment and Plan / ED Course  I have reviewed the triage vital signs and the nursing notes.  Pertinent labs & imaging results that were available during my care of the patient were reviewed by me and considered in my medical decision making (see chart for details).  Clinical Course as of Jul 30 1602  Mon Jul 31, 2019  1555 Lab called, unable to cross over delta troponin results. Resulted printed: value is 6 ng/L   [HK]    Clinical Course User Index [HK] Delia Heady, PA-C       53 year old female presenting to the ED with evaluation of left-sided lower chest/epigastric pain for the past 24 hours.  Symptoms began while she was donating plasma yesterday.  She usually donates plasma twice a week.  She reports cramping sensation in her left  forearm. These symptoms have never happened to her before.  She has a family history of heart disease including her mother so she wanted to "get everything checked out."  She denies any cough, shortness of breath, vomiting, diarrhea, leg swelling, history of DVT, PE or MI.  She does note that she has been anxious since this happened yesterday.  On my exam patient is overall well-appearing.  Tenderness palpation of the left lower chest as noted in the image without overlying skin changes.  No overlying skin changes of left forearm.  Areas neurovascularly intact, equal grip strength bilaterally.  EKG shows sinus rhythm, no changes from prior.  Chest x-ray with chronic changes but no acute changes.  Initial-troponin are both negative.  CBC, CMP unremarkable.  Lipase unremarkable.  With improvement in her symptoms here.  She is low risk by Wells criteria for PE, low risk by heart score.  Suspect that her symptoms are musculoskeletal in nature as her pain is reproducible with palpation on my exam.  Her vital signs are within normal limits, she is not tachycardic, tachypneic or hypoxic.  I have her follow-up with PCP and return for worsening symptoms.  Patient is hemodynamically stable, in NAD, and able to ambulate in the ED. Evaluation does not show pathology that would require ongoing emergent intervention or inpatient treatment. I explained the diagnosis to the patient. Pain has been managed and has no complaints prior to discharge. Patient is comfortable with above plan and is stable for discharge at this time. All questions were answered prior to disposition. Strict return precautions for returning to the ED were discussed. Encouraged follow up with PCP.   An After Visit Summary was printed and given to the patient.   Portions of this note were generated with  Scientist, clinical (histocompatibility and immunogenetics). Dictation errors may occur despite best attempts at proofreading.   Final Clinical Impressions(s) / ED Diagnoses   Final  diagnoses:  Chest wall pain    ED Discharge Orders    None       Dietrich Pates, PA-C 07/31/19 1604    Arby Barrette, MD 08/02/19 [phone]

## 2021-04-09 ENCOUNTER — Encounter (HOSPITAL_COMMUNITY): Payer: Self-pay | Admitting: Emergency Medicine

## 2021-04-09 ENCOUNTER — Emergency Department (HOSPITAL_COMMUNITY)
Admission: EM | Admit: 2021-04-09 | Discharge: 2021-04-09 | Disposition: A | Discharge status: Home / Self Care | Payer: Self-pay | Attending: Emergency Medicine | Admitting: Emergency Medicine

## 2021-04-09 ENCOUNTER — Other Ambulatory Visit: Payer: Self-pay

## 2021-04-09 ENCOUNTER — Emergency Department (HOSPITAL_COMMUNITY): Payer: Self-pay

## 2021-04-09 DIAGNOSIS — Y9289 Other specified places as the place of occurrence of the external cause: Secondary | ICD-10-CM | POA: Insufficient documentation

## 2021-04-09 DIAGNOSIS — F17210 Nicotine dependence, cigarettes, uncomplicated: Secondary | ICD-10-CM | POA: Insufficient documentation

## 2021-04-09 DIAGNOSIS — M79672 Pain in left foot: Secondary | ICD-10-CM | POA: Insufficient documentation

## 2021-04-09 DIAGNOSIS — Y9301 Activity, walking, marching and hiking: Secondary | ICD-10-CM | POA: Insufficient documentation

## 2021-04-09 DIAGNOSIS — W010XXA Fall on same level from slipping, tripping and stumbling without subsequent striking against object, initial encounter: Secondary | ICD-10-CM | POA: Insufficient documentation

## 2021-04-09 MED ORDER — ACETAMINOPHEN 325 MG PO TABS
650.0000 mg | ORAL_TABLET | Freq: Once | ORAL | Status: AC
  Administered 2021-04-09: 650 mg via ORAL
  Filled 2021-04-09: qty 2

## 2021-04-09 NOTE — ED Notes (Signed)
Pt verbalized understanding of discharge paperwork and follow-up care. Post-op shoe placed.

## 2021-04-09 NOTE — ED Triage Notes (Signed)
Pt to the ED after tripping walking around three dogs.  Pt has left foot pain, ambulated to the triage with a limp.

## 2021-04-09 NOTE — ED Provider Notes (Signed)
[facility] EMERGENCY DEPARTMENT Provider Note   CSN: [id number] Arrival date & time: 04/09/21  1411     History Chief Complaint  Patient presents with   Foot Pain    [name] is a 55 y.o. female.  HPI   55 y/o female who presents after a mechanical fall that occurred while talking her dogs PTA. She is c/o left foot pain. Pain has worsened since onset. Pain worse with weight bearing. Rates pain 1/10 at rest and 10/10 with ambulation. Denies head trauma.   History reviewed. No pertinent past medical history.  Patient Active Problem List   Diagnosis Date Noted   Breast lump on left side at 3 o'clock position 12/13/2012   Pain in right axilla 12/13/2012    Past Surgical History:  Procedure Laterality Date   TONSILLECTOMY     TUBAL LIGATION       OB History     Gravida  4   Para  2   Term  2   Preterm      AB  2   Living  2      SAB  2   IAB      Ectopic      Multiple      Live Births              Family History  Problem Relation Age of Onset   Diabetes Mother    Hypertension Mother    Cancer Mother        breast   COPD Father    Diabetes Maternal Grandmother    Hypertension Maternal Grandmother    Stroke Maternal Grandmother    Heart disease Maternal Grandmother     Social History   Tobacco Use   Smoking status: Every Day    Packs/day: 0.50    Years: 31.00    Pack years: 15.50    Types: Cigarettes   Smokeless tobacco: Never  Vaping Use   Vaping Use: Every day  Substance Use Topics   Alcohol use: Yes    Comment: beer 3 x week   Drug use: Yes    Frequency: 3.0 times per week    Types: Marijuana    Home Medications Prior to Admission medications   Medication Sig Start Date End Date Taking? Authorizing Provider  diclofenac (VOLTAREN) 75 MG EC tablet Take 1 tablet (75 mg total) by mouth 2 (two) times daily. Take with food 06/25/15   Triplett, Tammy, PA-C  HYDROcodone-acetaminophen (NORCO/VICODIN) 5-325 MG per tablet  Take one-two tabs po q 4-6 hrs prn pain 06/25/15   Triplett, Tammy, PA-C  ibuprofen (ADVIL,MOTRIN) 600 MG tablet Take 1 tablet (600 mg total) by mouth every 6 (six) hours as needed. 07/23/17   Burgess Amor, PA-C  oxyCODONE-acetaminophen (PERCOCET/ROXICET) 5-325 MG tablet Take 1 tablet by mouth every 4 (four) hours as needed. 07/23/17   Burgess Amor, PA-C  traMADol (ULTRAM) 50 MG tablet Take 1 tablet (50 mg total) by mouth every 6 (six) hours as needed. 07/10/18   Triplett, Tammy, PA-C    Allergies    Vicodin [hydrocodone-acetaminophen]  Review of Systems   Review of Systems  Constitutional:  Negative for fever.  Musculoskeletal:        Left foot pain  Skin:  Positive for color change.  Neurological:  Negative for weakness and numbness.   Physical Exam Updated Vital Signs BP 114/69 (BP Location: Right Arm)   Pulse 80   Temp 98.2 F (36.8 C) (Oral)  Resp 18   Ht 5\' 6"  (1.676 m)   Wt 54.4 kg   LMP 11/01/2013   SpO2 96%   BMI 19.37 kg/m   Physical Exam Vitals and nursing note reviewed.  Constitutional:      General: She is not in acute distress.    Appearance: She is well-developed.  HENT:     Head: Normocephalic and atraumatic.  Eyes:     Conjunctiva/sclera: Conjunctivae normal.  Cardiovascular:     Rate and Rhythm: Normal rate.  Pulmonary:     Effort: Pulmonary effort is normal.  Musculoskeletal:        General: Normal range of motion.     Cervical back: Neck supple.     Comments: TTP to the left medial foot with some mild swelling present. Nvi. No ttp proximally  Skin:    General: Skin is warm and dry.  Neurological:     Mental Status: She is alert.    ED Results / Procedures / Treatments   Labs (all labs ordered are listed, but only abnormal results are displayed) Labs Reviewed - No data to display  EKG None  Radiology DG Foot Complete Left  Result Date: 04/09/2021 CLINICAL DATA:  Medial foot pain EXAM: LEFT FOOT - COMPLETE 3+ VIEW COMPARISON:  None.  FINDINGS: No fracture or malalignment. Mild degenerative change at the first MTP joint. Soft tissues are unremarkable. IMPRESSION: No acute osseous abnormality Electronically Signed   By: 04/11/2021 M.D.   On: 04/09/2021 15:22    Procedures Procedures   Medications Ordered in ED Medications  acetaminophen (TYLENOL) tablet 650 mg (has no administration in time range)    ED Course  I have reviewed the triage vital signs and the nursing notes.  Pertinent labs & imaging results that were available during my care of the patient were reviewed by me and considered in my medical decision making (see chart for details).    MDM Rules/Calculators/A&P                          Pt with mechanical fall pta c/o left foot pain. Denies other injuries. Xray reviewed/interpreted and there are no acute fractures or traumatic findings. Declined crutches. Post op shoe provided. Advised otc pain meds. Advised to f/u with ortho and return if worse. She voices understanding and reasons to return. All questions answered, pt stable for discharge   Final Clinical Impression(s) / ED Diagnoses Final diagnoses:  Foot pain, left    Rx / DC Orders ED Discharge Orders     None        04/11/2021 04/09/21 1535    04/11/21, MD 04/09/21 [phone]

## 2021-04-09 NOTE — Discharge Instructions (Addendum)
You may alternate taking Tylenol and Ibuprofen as needed for pain control. You may take 400-600 mg of ibuprofen every 6 hours and [phone] mg of Tylenol every 6 hours. Do not exceed 4000 mg of Tylenol daily as this can lead to liver damage. Also, make sure to take Ibuprofen with meals as it can cause an upset stomach. Do not take other NSAIDs while taking Ibuprofen such as (Aleve, Naprosyn, Aspirin, Celebrex, etc) and do not take more than the prescribed dose as this can lead to ulcers and bleeding in your GI tract. You may use warm and cold compresses to help with your symptoms.   Please follow up with your primary doctor or the orthopedic doctor within the next 7-10 days for re-evaluation and further treatment of your symptoms.   Please return to the ER sooner if you have any new or worsening symptoms.

## 2021-08-04 ENCOUNTER — Encounter: Payer: Self-pay | Admitting: Emergency Medicine

## 2021-08-04 ENCOUNTER — Ambulatory Visit
Admission: EM | Admit: 2021-08-04 | Discharge: 2021-08-04 | Disposition: A | Discharge status: Home / Self Care | Payer: Self-pay | Attending: Urgent Care | Admitting: Urgent Care

## 2021-08-04 ENCOUNTER — Other Ambulatory Visit: Payer: Self-pay

## 2021-08-04 DIAGNOSIS — M79671 Pain in right foot: Secondary | ICD-10-CM

## 2021-08-04 DIAGNOSIS — S96911A Strain of unspecified muscle and tendon at ankle and foot level, right foot, initial encounter: Secondary | ICD-10-CM

## 2021-08-04 MED ORDER — NAPROXEN 500 MG PO TABS: 500.0000 mg | ORAL_TABLET | Freq: Two times a day (BID) | ORAL | 0 refills | Status: DC

## 2021-08-04 NOTE — ED Provider Notes (Signed)
[facility]   MRN: [id number] DOB: [birth year]  Subjective:   [name] is a 55 y.o. female presenting for 3-day history of acute onset persistent right foot pain with trace swelling over the top of her foot.  Patient states that symptoms started after she did a very strenuous job on Friday.  She had to climb 100s of stairs while carrying multiple items.  No fall, trauma, bruising, history of gout.  Patient has used ibuprofen twice.  Initially had symptoms of the left foot as well but this has improved.  Denies taking chronic medications.    Allergies  Allergen Reactions   Vicodin [Hydrocodone-Acetaminophen] Nausea And Vomiting    History reviewed. No pertinent past medical history.   Past Surgical History:  Procedure Laterality Date   TONSILLECTOMY     TUBAL LIGATION      Family History  Problem Relation Age of Onset   Diabetes Mother    Hypertension Mother    Cancer Mother        breast   COPD Father    Diabetes Maternal Grandmother    Hypertension Maternal Grandmother    Stroke Maternal Grandmother    Heart disease Maternal Grandmother     Social History   Tobacco Use   Smoking status: Every Day    Packs/day: 0.50    Years: 31.00    Pack years: 15.50    Types: Cigarettes   Smokeless tobacco: Never  Vaping Use   Vaping Use: Every day  Substance Use Topics   Alcohol use: Yes    Comment: beer 3 x week   Drug use: Yes    Frequency: 3.0 times per week    Types: Marijuana    ROS   Objective:   Vitals: BP 115/72   Pulse (!) 57   Temp 97.7 F (36.5 C)   Resp 18   LMP 11/01/2013   SpO2 98%   Physical Exam Constitutional:      General: She is not in acute distress.    Appearance: Normal appearance. She is well-developed. She is not ill-appearing.  HENT:     Head: Normocephalic and atraumatic.     Nose: Nose normal.     Mouth/Throat:     Mouth: Mucous membranes are moist.     Pharynx: Oropharynx is clear.  Eyes:      General: No scleral icterus.    Extraocular Movements: Extraocular movements intact.     Pupils: Pupils are equal, round, and reactive to light.  Cardiovascular:     Rate and Rhythm: Normal rate.  Pulmonary:     Effort: Pulmonary effort is normal.  Musculoskeletal:       Feet:     Comments: Tenderness over area outlined without ecchymosis, swelling, erythema, bony deformity.  Full range of motion.  Skin:    General: Skin is warm and dry.  Neurological:     General: No focal deficit present.     Mental Status: She is alert and oriented to person, place, and time.  Psychiatric:        Mood and Affect: Mood normal.        Behavior: Behavior normal.    Assessment and Plan :   PDMP not reviewed this encounter.  1. Right foot pain   2. Right foot strain, initial encounter    Offered patient an x-ray but she declined, I am in agreement that there was no trauma, no suspicion for fracture.  Recommended RICE method, naproxen for pain  and inflammation.  Counseled patient on potential for adverse effects with medications prescribed/recommended today, ER and return-to-clinic precautions discussed, patient verbalized understanding.    Wallis Bamberg, PA-C 08/04/21 1316

## 2021-08-04 NOTE — ED Triage Notes (Signed)
Pt is present today with right foot pain. Pt states that the pain is located on the top of her right foot. Pt describes the pain as being sharp. Pt states the pain started Friday

## 2021-09-19 ENCOUNTER — Ambulatory Visit (INDEPENDENT_AMBULATORY_CARE_PROVIDER_SITE_OTHER): Payer: Self-pay

## 2021-09-19 ENCOUNTER — Other Ambulatory Visit: Payer: Self-pay

## 2021-09-19 ENCOUNTER — Ambulatory Visit
Admission: RE | Admit: 2021-09-19 | Discharge: 2021-09-19 | Disposition: A | Discharge status: Home / Self Care | Payer: Medicaid Other | Source: Ambulatory Visit | Attending: Family Medicine | Admitting: Family Medicine

## 2021-09-19 VITALS — BP 119/74 | HR 55 | Temp 98.3°F | Resp 14

## 2021-09-19 DIAGNOSIS — M79672 Pain in left foot: Secondary | ICD-10-CM

## 2021-09-19 MED ORDER — CYCLOBENZAPRINE HCL 5 MG PO TABS: 5.0000 mg | ORAL_TABLET | Freq: Three times a day (TID) | ORAL | 0 refills | Status: DC | PRN

## 2021-09-19 MED ORDER — PREDNISONE 20 MG PO TABS: 40.0000 mg | ORAL_TABLET | Freq: Every day | ORAL | 0 refills | Status: DC

## 2021-09-19 NOTE — ED Triage Notes (Signed)
Patient c/o LFT foot pain x 2 weeks.   Patient denies fall or trauma.   Patient endorses increased pain with ambulation or "hanging foot".   Patient endorses swelling and itchiness.   Patient has taken ibuprofen with no relief of symptoms.

## 2021-09-22 NOTE — ED Provider Notes (Signed)
[facility]    CSN: [id number] Arrival date & time: 09/19/21  1141      History   Chief Complaint Chief Complaint  Patient presents with   Foot Pain   APPT 1200    HPI [name] is a 55 y.o. female.   Presenting today with 2 week history of left lateral foot pain with no known injury to area. States pain is made worse with ambulation or pointing foot. Denies swelling, discoloration, rashes, skin injury, numbness, tingling, weakness. So far trying ibuprofen with no relief.    History reviewed. No pertinent past medical history.  Patient Active Problem List   Diagnosis Date Noted   Breast lump on left side at 3 o'clock position 12/13/2012   Pain in right axilla 12/13/2012    Past Surgical History:  Procedure Laterality Date   TONSILLECTOMY     TUBAL LIGATION      OB History     Gravida  4   Para  2   Term  2   Preterm      AB  2   Living  2      SAB  2   IAB      Ectopic      Multiple      Live Births               Home Medications    Prior to Admission medications   Medication Sig Start Date End Date Taking? Authorizing Provider  cyclobenzaprine (FLEXERIL) 5 MG tablet Take 1 tablet (5 mg total) by mouth 3 (three) times daily as needed for muscle spasms. Do Not drink alcohol or drive while taking this medication.  May cause drowsiness. 09/19/21  Yes Particia Nearing, PA-C  ibuprofen (ADVIL,MOTRIN) 600 MG tablet Take 1 tablet (600 mg total) by mouth every 6 (six) hours as needed. 07/23/17  Yes Idol, Raynelle Fanning, PA-C  predniSONE (DELTASONE) 20 MG tablet Take 2 tablets (40 mg total) by mouth daily with breakfast. 09/19/21  Yes Particia Nearing, PA-C  naproxen (NAPROSYN) 500 MG tablet Take 1 tablet (500 mg total) by mouth 2 (two) times daily with a meal. 08/04/21   Wallis Bamberg, PA-C    Family History Family History  Problem Relation Age of Onset   Diabetes Mother    Hypertension Mother    Cancer Mother         breast   COPD Father    Diabetes Maternal Grandmother    Hypertension Maternal Grandmother    Stroke Maternal Grandmother    Heart disease Maternal Grandmother     Social History Social History   Tobacco Use   Smoking status: Every Day    Packs/day: 0.50    Years: 31.00    Pack years: 15.50    Types: Cigarettes   Smokeless tobacco: Never  Vaping Use   Vaping Use: Every day  Substance Use Topics   Alcohol use: Yes    Comment: beer 3 x week   Drug use: Yes    Frequency: 3.0 times per week    Types: Marijuana     Allergies   Vicodin [hydrocodone-acetaminophen]   Review of Systems Review of Systems PER HPI   Physical Exam Triage Vital Signs ED Triage Vitals [09/19/21 1215]  Enc Vitals Group     BP 119/74     Pulse Rate (!) 55     Resp 14     Temp 98.3 F (36.8 C)  Temp Source Oral     SpO2 97 %     Weight      Height      Head Circumference      Peak Flow      Pain Score 6     Pain Loc      Pain Edu?      Excl. in GC?    No data found.  Updated Vital Signs BP 119/74 (BP Location: Right Arm)   Pulse (!) 55   Temp 98.3 F (36.8 C) (Oral)   Resp 14   LMP 11/01/2013   SpO2 97%   Visual Acuity Right Eye Distance:   Left Eye Distance:   Bilateral Distance:    Right Eye Near:   Left Eye Near:    Bilateral Near:     Physical Exam Vitals and nursing note reviewed.  Constitutional:      Appearance: Normal appearance. She is not ill-appearing.  HENT:     Head: Atraumatic.  Eyes:     Extraocular Movements: Extraocular movements intact.     Conjunctiva/sclera: Conjunctivae normal.  Cardiovascular:     Rate and Rhythm: Normal rate and regular rhythm.     Heart sounds: Normal heart sounds.  Pulmonary:     Effort: Pulmonary effort is normal.     Breath sounds: Normal breath sounds.  Musculoskeletal:        General: Tenderness present. No signs of injury. Normal range of motion.     Cervical back: Normal range of motion and neck supple.      Comments: Left lateral dorsal foot ttp diffusely, no edema or deformity palpable  Skin:    General: Skin is warm and dry.     Findings: No erythema or rash.  Neurological:     Mental Status: She is alert and oriented to person, place, and time.     Sensory: No sensory deficit.  Psychiatric:        Mood and Affect: Mood normal.        Thought Content: Thought content normal.        Judgment: Judgment normal.     UC Treatments / Results  Labs (all labs ordered are listed, but only abnormal results are displayed) Labs Reviewed - No data to display  EKG   Radiology No results found.  Procedures Procedures (including critical care time)  Medications Ordered in UC Medications - No data to display  Initial Impression / Assessment and Plan / UC Course  I have reviewed the triage vital signs and the nursing notes.  Pertinent labs & imaging results that were available during my care of the patient were reviewed by me and considered in my medical decision making (see chart for details).     Left foot x-ray neg for acute bony injury, suspect strain causing sxs. Treat with prednisone, flexeril, RICE protocol. Return for worsening sxs.  Final Clinical Impressions(s) / UC Diagnoses   Final diagnoses:  Left foot pain   Discharge Instructions   None    ED Prescriptions     Medication Sig Dispense Auth. Provider   predniSONE (DELTASONE) 20 MG tablet Take 2 tablets (40 mg total) by mouth daily with breakfast. 10 tablet Particia Nearing, PA-C   cyclobenzaprine (FLEXERIL) 5 MG tablet Take 1 tablet (5 mg total) by mouth 3 (three) times daily as needed for muscle spasms. Do Not drink alcohol or drive while taking this medication.  May cause drowsiness. 15 tablet Particia Nearing, [place]  PDMP not reviewed this encounter.   Particia Nearing, [place] 09/22/21 2218

## 2021-12-23 ENCOUNTER — Inpatient Hospital Stay
Admission: RE | Admit: 2021-12-23 | Discharge: 2021-12-23 | Disposition: A | Discharge status: Home / Self Care | Payer: Self-pay | Source: Ambulatory Visit

## 2021-12-23 ENCOUNTER — Other Ambulatory Visit: Payer: Self-pay

## 2022-02-07 ENCOUNTER — Other Ambulatory Visit: Payer: Self-pay

## 2022-02-07 ENCOUNTER — Emergency Department (HOSPITAL_COMMUNITY)
Admission: EM | Admit: 2022-02-07 | Discharge: 2022-02-07 | Disposition: A | Discharge status: Home / Self Care | Payer: Medicaid Other | Attending: Student | Admitting: Student

## 2022-02-07 ENCOUNTER — Emergency Department (HOSPITAL_COMMUNITY): Payer: Medicaid Other

## 2022-02-07 ENCOUNTER — Encounter (HOSPITAL_COMMUNITY): Payer: Self-pay | Admitting: Emergency Medicine

## 2022-02-07 DIAGNOSIS — S62339A Displaced fracture of neck of unspecified metacarpal bone, initial encounter for closed fracture: Secondary | ICD-10-CM

## 2022-02-07 DIAGNOSIS — S60512A Abrasion of left hand, initial encounter: Secondary | ICD-10-CM | POA: Insufficient documentation

## 2022-02-07 DIAGNOSIS — W010XXA Fall on same level from slipping, tripping and stumbling without subsequent striking against object, initial encounter: Secondary | ICD-10-CM | POA: Insufficient documentation

## 2022-02-07 DIAGNOSIS — M79641 Pain in right hand: Secondary | ICD-10-CM | POA: Insufficient documentation

## 2022-02-07 DIAGNOSIS — S62316A Displaced fracture of base of fifth metacarpal bone, right hand, initial encounter for closed fracture: Secondary | ICD-10-CM | POA: Insufficient documentation

## 2022-02-07 MED ORDER — IBUPROFEN 800 MG PO TABS
800.0000 mg | ORAL_TABLET | Freq: Once | ORAL | Status: AC
  Administered 2022-02-07: 800 mg via ORAL
  Filled 2022-02-07: qty 1

## 2022-02-07 MED ORDER — FENTANYL CITRATE PF 50 MCG/ML IJ SOSY
50.0000 ug | PREFILLED_SYRINGE | Freq: Once | INTRAMUSCULAR | Status: AC
  Administered 2022-02-07: 50 ug via INTRAMUSCULAR
  Filled 2022-02-07: qty 1

## 2022-02-07 NOTE — Discharge Instructions (Addendum)
Take Tylenol and Motrin for pain. ?Wear the splint until seen by orthopedics for follow-up.   ?Monday morning please call the [facility] above and schedule outpatient follow-up with a hand specialist.  ?

## 2022-02-07 NOTE — ED Triage Notes (Signed)
Patient c/o bilateral hand pain after tripping over some wires and falling. Per patient caught herself with hands. Right hand and wrist pain worse with swelling and decreased ROM. Denies hitting head. Patient has bandaids to palms of hands bilateral. Dressing clean and intact.  ?

## 2022-02-07 NOTE — ED Provider Notes (Signed)
?[facility] ?Provider Note ? ? ?CSN: [id number] ?Arrival date & time: 02/07/22  1759 ? ?  ? ?History ? ?Chief Complaint  ?Patient presents with  ? Fall  ? ? ?[name] is a 55 y.o. female. ? ? ?Fall ? ? ?Patient presents with bilateral hand/wrist pain after a fall.  Patient was chasing after her grandchildren, she lost balance and fell on outstretched hands.  She is right-hand dominant, the pain is mostly in the right hand and wrist on the ulnar side of the hand in particular.  Right hand she is able to move without difficulty, any movement of the left hand elicits significant pain.  Denies any paresthesias, has not taken anything for the pain yet.  Not on any blood thinners, did not hit her head. ? ?Home Medications ?Prior to Admission medications   ?Medication Sig Start Date End Date Taking? Authorizing Provider  ?cyclobenzaprine (FLEXERIL) 5 MG tablet Take 1 tablet (5 mg total) by mouth 3 (three) times daily as needed for muscle spasms. Do Not drink alcohol or drive while taking this medication.  May cause drowsiness. 09/19/21   Particia Nearing, PA-C  ?ibuprofen (ADVIL,MOTRIN) 600 MG tablet Take 1 tablet (600 mg total) by mouth every 6 (six) hours as needed. 07/23/17   Burgess Amor, PA-C  ?naproxen (NAPROSYN) 500 MG tablet Take 1 tablet (500 mg total) by mouth 2 (two) times daily with a meal. 08/04/21   Wallis Bamberg, PA-C  ?predniSONE (DELTASONE) 20 MG tablet Take 2 tablets (40 mg total) by mouth daily with breakfast. 09/19/21   Particia Nearing, PA-C  ?   ? ?Allergies    ?Percocet [oxycodone-acetaminophen] and Vicodin [hydrocodone-acetaminophen]   ? ?Review of Systems   ?Review of Systems ? ?Physical Exam ?Updated Vital Signs ?BP 113/67 (BP Location: Left Arm)   Pulse 91   Temp 98.7 ?F (37.1 ?C) (Oral)   Resp 14   Ht 5\' 6"  (1.676 m)   Wt 57.6 kg   LMP 11/01/2013   SpO2 96%   BMI 20.50 kg/m?  ?Physical Exam ?Vitals and nursing note reviewed. Exam conducted with a  chaperone present.  ?Constitutional:   ?   General: She is not in acute distress. ?   Appearance: Normal appearance.  ?HENT:  ?   Head: Normocephalic and atraumatic.  ?Eyes:  ?   General: No scleral icterus. ?   Extraocular Movements: Extraocular movements intact.  ?   Pupils: Pupils are equal, round, and reactive to light.  ?Cardiovascular:  ?   Pulses: Normal pulses.  ?Musculoskeletal:     ?   General: Tenderness present.  ?   Comments: Left wrist: Full ROM, no tenderness.  Left hand without any dorsal tenderness, able to make a fist, thumb abduction and abduction fully intact.  Right wrist: Tolerates some passive ROM, unable to perform active ROM secondary to pain.  Unable to make a fist, tenderness to ulnar side of hand and wrist diffusely, no specific crepitus.  No tenderness to elbow, shoulders.  ?Skin: ?   Capillary Refill: Capillary refill takes less than 2 seconds.  ?   Coloration: Skin is not jaundiced.  ?   Comments: Abrasions to the palms of both hands and ulna  ?Neurological:  ?   Mental Status: She is alert. Mental status is at baseline.  ?   Coordination: Coordination normal.  ?   Comments: Sensation to light touch fully intact  ? ? ?ED Results / Procedures / Treatments   ?  Labs ?(all labs ordered are listed, but only abnormal results are displayed) ?Labs Reviewed - No data to display ? ?EKG ?None ? ?Radiology ?DG Wrist Complete Right ? ?Result Date: 02/07/2022 ?CLINICAL DATA:  Trauma, pain EXAM: RIGHT WRIST - COMPLETE 3+ VIEW COMPARISON:  None. FINDINGS: There is comminuted fracture in the base of right fifth metacarpal. There is no significant displacement of fracture fragments. Degenerative changes with bony spurs seen in first carpometacarpal and first metacarpophalangeal joints. IMPRESSION: Comminuted fracture is seen in the base of right fifth metacarpal. Electronically Signed   By: Ernie Avena M.D.   On: 02/07/2022 18:59  ? ?DG Hand Complete Right ? ?Result Date: 02/07/2022 ?CLINICAL DATA:   Status post fall with subsequent bilateral hand pain. EXAM: RIGHT HAND - COMPLETE 3+ VIEW COMPARISON:  None. FINDINGS: An acute fracture deformity is seen involving the base of the fifth right metacarpal. There is no evidence of dislocation. There is no evidence of arthropathy or other focal bone abnormality. Mild soft tissue swelling is noted along the previously described fracture site. IMPRESSION: Acute fracture of the fifth right metacarpal. Electronically Signed   By: Aram Candela M.D.   On: 02/07/2022 18:58   ? ?Procedures ?Procedures  ? ? ?Medications Ordered in ED ?Medications  ?ibuprofen (ADVIL) tablet 800 mg (has no administration in time range)  ?fentaNYL (SUBLIMAZE) injection 50 mcg (has no administration in time range)  ? ? ?ED Course/ Medical Decision Making/ A&P ?  ?                        ?Medical Decision Making ?Amount and/or Complexity of Data Reviewed ?Radiology: ordered. ? ?Risk ?Prescription drug management. ? ? ?Patient is a 56 year old presenting today due to fall on outstretched hand.  She is right-hand dominant.  Neurovascular intact with radial pulse 2+, brisk cap refill.  X-rays ordered, viewed and interpreted by myself.  Notable for fifth MCP fracture.  We will put patient in ulnar gutter splint, I ordered patient on Motrin.  Will discharge patient with anti-inflammatories and hand follow-up.  Return precautions discussed, discharged in stable condition. ? ? ? ? ? ? ? ?Final Clinical Impression(s) / ED Diagnoses ?Final diagnoses:  ?None  ? ? ?Rx / DC Orders ?ED Discharge Orders   ? ? None  ? ?  ? ? ?  ?Theron Arista, PA-C ?02/07/22 1904 ? ?  ?Glendora Score, MD ?02/08/22 0011 ? ?

## 2022-02-10 ENCOUNTER — Telehealth: Payer: Self-pay

## 2022-02-10 NOTE — Telephone Encounter (Signed)
Attempted to call newly enrolled client of [facility] to set up [facility] appointment [facility] to [facility] care. No answer, left voicemail. ? ?Francee Nodal RN ?[employer] ?

## 2022-02-11 ENCOUNTER — Telehealth: Payer: Self-pay

## 2022-02-11 NOTE — Telephone Encounter (Signed)
Called client who has newly enrolled with [facility] on 02/10/22.  ?Called to assist client to set up a new patient appointment to establish Primary Medical care with [facility]. ?Appointment made for 02/12/22 at 0900. With verbal consent text appointment date, time and location along with Clinic contact number. ? ?Note: Client with recent fall resulting in fracture of her hand, she is in a splint and wrapped, but was told she would need to see orthopedist. She states she is still in pain and is concerned that the abrasions to the palm of her hand may not have been cleaned out well and she is concerned. She was told to leave splint on. Encouraged her to discuss her concerns with provider at her appointment. She is also concerned about getting her hand "casted" so it can heal. Discussed that provider will review and discuss plan of care and any referrals. ?She states understanding. ? ?Will plan follow up after her first visit.  ? ?Francee Nodal RN ?[employer] ?

## 2022-02-12 ENCOUNTER — Other Ambulatory Visit: Payer: Self-pay | Admitting: Physician Assistant

## 2022-02-12 ENCOUNTER — Telehealth: Payer: Self-pay | Admitting: Physician Assistant

## 2022-02-12 ENCOUNTER — Ambulatory Visit: Payer: Medicaid Other | Admitting: Physician Assistant

## 2022-02-12 ENCOUNTER — Encounter: Payer: Self-pay | Admitting: Physician Assistant

## 2022-02-12 ENCOUNTER — Telehealth: Payer: Self-pay

## 2022-02-12 VITALS — BP 112/70 | HR 66 | Temp 96.0°F | Ht 65.0 in | Wt 122.0 lb

## 2022-02-12 DIAGNOSIS — Z7689 Persons encountering health services in other specified circumstances: Secondary | ICD-10-CM

## 2022-02-12 DIAGNOSIS — Z1211 Encounter for screening for malignant neoplasm of colon: Secondary | ICD-10-CM

## 2022-02-12 DIAGNOSIS — F17200 Nicotine dependence, unspecified, uncomplicated: Secondary | ICD-10-CM

## 2022-02-12 DIAGNOSIS — Z13220 Encounter for screening for lipoid disorders: Secondary | ICD-10-CM

## 2022-02-12 DIAGNOSIS — S62339D Displaced fracture of neck of unspecified metacarpal bone, subsequent encounter for fracture with routine healing: Secondary | ICD-10-CM

## 2022-02-12 DIAGNOSIS — F419 Anxiety disorder, unspecified: Secondary | ICD-10-CM

## 2022-02-12 DIAGNOSIS — Z1239 Encounter for other screening for malignant neoplasm of breast: Secondary | ICD-10-CM

## 2022-02-12 NOTE — Telephone Encounter (Signed)
Attempted call to follow up after first visit with [facility] to establish care. No answer, left voicemail. ? ? ?Francee Nodal RN ?[employer] ?

## 2022-02-12 NOTE — Telephone Encounter (Signed)
Pt was called.  Got VM.  Left message that there is no cafa on file so she needs to submit application as discussed at appt this morning.  CC can help her with that process; that number was left for her. ?

## 2022-02-12 NOTE — Progress Notes (Signed)
? ?BP 112/70   Pulse 66   Temp (!) 96 ?F (35.6 ?C)   Ht 5\' 5"  (1.651 m)   Wt 122 lb (55.3 kg)   LMP 11/01/2013   SpO2 99%   BMI 20.30 kg/m?   ? ?Subjective:  ? ? Patient ID: [name], female    DOB: Jan 30, 1966, 56 y.o.   MRN: [id number] ? ?HPI: ?[name] is a 56 y.o. female presenting on 02/12/2022 for New Patient (Initial Visit) ? ? ?HPI ? ? ?Chief Complaint  ?Patient presents with  ? New Patient (Initial Visit)  ? ? ? ? ?Long time since she's had a PCP ?Last mammogram and PAP  2014 ?+ anciety.  Bites ails ?04/14/2022 last Saturday and broke hand ?She recently started a new job at [employer]. ? ? ? ?Relevant past medical, surgical, family and social history reviewed and updated as indicated. Interim medical history since our last visit reviewed. ?Allergies and medications reviewed and updated. ? ? ?Current Outpatient Medications:  ?  acetaminophen (TYLENOL) 500 MG tablet, Take 500 mg by mouth every 6 (six) hours as needed., Disp: , Rfl:  ?  ibuprofen (ADVIL) 200 MG tablet, Take 200 mg by mouth every 6 (six) hours as needed., Disp: , Rfl:  ? ? ?Review of Systems ? ?Per HPI unless specifically indicated above ? ?   ?Objective:  ?  ?BP 112/70   Pulse 66   Temp (!) 96 ?F (35.6 ?C)   Ht 5\' 5"  (1.651 m)   Wt 122 lb (55.3 kg)   LMP 11/01/2013   SpO2 99%   BMI 20.30 kg/m?   ?Wt Readings from Last 3 Encounters:  ?02/12/22 122 lb (55.3 kg)  ?02/07/22 127 lb (57.6 kg)  ?04/09/21 120 lb (54.4 kg)  ?  ?Physical Exam ?Vitals reviewed.  ?Constitutional:   ?   General: She is not in acute distress. ?   Appearance: She is well-developed. She is not toxic-appearing.  ?HENT:  ?   Head: Normocephalic and atraumatic.  ?   Right Ear: External ear normal. There is impacted cerumen.  ?   Left Ear: Tympanic membrane, ear canal and external ear normal.  ?Eyes:  ?   Extraocular Movements: Extraocular movements intact.  ?   Conjunctiva/sclera: Conjunctivae normal.  ?   Pupils: Pupils are equal, round, and  reactive to light.  ?Neck:  ?   Thyroid: No thyromegaly.  ?Cardiovascular:  ?   Rate and Rhythm: Normal rate and regular rhythm.  ?Pulmonary:  ?   Effort: Pulmonary effort is normal.  ?   Breath sounds: Normal breath sounds.  ?Abdominal:  ?   General: Bowel sounds are normal.  ?   Palpations: Abdomen is soft. There is no mass.  ?   Tenderness: There is no abdominal tenderness.  ?Musculoskeletal:  ?   Cervical back: Neck supple.  ?   Right lower leg: No edema.  ?   Left lower leg: No edema.  ?   Comments: Right UE in splint.  Fingers with good sensation and cap refill.  Fingers without obvious swelling.  Splint not removed.    ?Lymphadenopathy:  ?   Cervical: No cervical adenopathy.  ?Skin: ?   General: Skin is warm and dry.  ?   Comments: Fingernails bitten down.  No secondary infection  ?Neurological:  ?   Mental Status: She is alert and oriented to person, place, and time.  ?   Gait: Gait is intact. Gait  normal.  ?Psychiatric:     ?   Attention and Perception: Attention normal.     ?   Speech: Speech normal.     ?   Behavior: Behavior normal. Behavior is cooperative.  ? ? ? ? ? ? ?   ?Assessment & Plan:  ? ?Encounter Diagnoses  ?Name Primary?  ? Encounter to establish care Yes  ? Closed boxer's fracture with routine healing, subsequent encounter   ? Anxiety   ? Screening cholesterol level   ? Tobacco use disorder   ? Encounter for screening for malignant neoplasm of breast, unspecified screening modality   ? Colon cancer screening   ? ? ? ?-educated and encouraged Covid vaccination ?-Ortho referral for  fracture  02/07/22.  Encouraged to continue to elevate the limb.  APAP or IBU prn ?-refer for screening Mammogram ?-will plan to update PAP after she is seen about her hand fracture ?-check baseline labs ?-made [facility]/counseling appt for anxiety ?-pt is given FIT test for colon cancer screening  ?-counseled smoking cessation ?-pt to follow up  1 month.  She is to contact office sooner prn ? ? ? ?

## 2022-02-12 NOTE — Patient Instructions (Signed)
-  blood tests ?Mammogram ?Colon cancer screening test ?Counselor appointment ?Orthopedic referral ?Covid vaccination ?Smoking cessation ? ? ?------------------------------------------------- ? ? ?COVID-19 vaccination significantly lowers your risk of severe illness, hospitalization, and death if you get infected. Compared to people who are up to date with their COVID-19 vaccinations, unvaccinated people aremore likely to get COVID-19, much more likely to be hospitalized with COVID-19, and much more likely to die from COVID-19. ?Like all vaccines, COVID-19 vaccines are not 100% effective at preventing infection. Some people who are up to date with their COVID-19 vaccinations will get COVID-19 breakthrough infection. However, staying up to date with your COVID-19 vaccinations means that you are less likely to have a breakthrough infection and, if you do get sick, you are less likely to get severely ill or die. Staying up to date with COVID-19 vaccination also means you are less likely to spread the disease to others and increases your protection against new variants of SARS-CoV-2, the virus that causes COVID-19. ?

## 2022-02-16 ENCOUNTER — Encounter: Payer: Self-pay | Admitting: Orthopedic Surgery

## 2022-02-16 ENCOUNTER — Ambulatory Visit (INDEPENDENT_AMBULATORY_CARE_PROVIDER_SITE_OTHER): Payer: Self-pay | Admitting: Orthopedic Surgery

## 2022-02-16 DIAGNOSIS — S62346A Nondisplaced fracture of base of fifth metacarpal bone, right hand, initial encounter for closed fracture: Secondary | ICD-10-CM

## 2022-02-16 DIAGNOSIS — Z720 Tobacco use: Secondary | ICD-10-CM

## 2022-02-16 LAB — POC FIT TEST STOOL: Fecal Occult Blood: NEGATIVE

## 2022-02-16 NOTE — Progress Notes (Signed)
Chief Complaint  ?Patient presents with  ? Fracture  ?  Right, 5th metacarpal DOI 02/07/22  ? ? ?HPI: 56 year old female status post injury to right hand sustained a base of fifth metacarpal fracture complains of pain and swelling over the dorsum of the right hand near the fifth metacarpal ? ?History reviewed. No pertinent past medical history. ? ?LMP 11/01/2013  ? ? ?General appearance: Well-developed well-nourished no gross deformities ? ?Cardiovascular normal pulse and perfusion normal color without edema ? ?Neurologically no sensation loss or deficits or pathologic reflexes ? ?Psychological: Awake alert and oriented x3 mood and affect normal ? ?Skin no lacerations or ulcerations no nodularity no palpable masses, no erythema or nodularity ? ?Musculoskeletal: Right hand exhibits pain swelling ecchymosis tenderness at the base of the fifth metacarpal without deformity ? ?Imaging my interpretation of the images are that the 3 hand x-rays show a base of fifth metacarpal fracture without significant displacement ? ?A/P ? ?Short arm cast for 6 weeks ? ?X-ray out of plaster 6 weeks ?

## 2022-02-18 ENCOUNTER — Ambulatory Visit: Payer: Medicaid Other | Admitting: Licensed Clinical Social Worker

## 2022-02-18 DIAGNOSIS — F419 Anxiety disorder, unspecified: Secondary | ICD-10-CM

## 2022-02-18 NOTE — Progress Notes (Signed)
[facility] engaged patient in initial [facility] visit. [facility] provided active listening and validation as patient shared about anxiety symptoms and stressors. [facility] reviewed patient's coping strategies and support systems with her. No SI/HI. ?

## 2022-03-03 ENCOUNTER — Ambulatory Visit: Payer: Medicaid Other | Admitting: Licensed Clinical Social Worker

## 2022-03-17 ENCOUNTER — Ambulatory Visit: Payer: Medicaid Other | Admitting: Physician Assistant

## 2022-03-17 DIAGNOSIS — S62346A Nondisplaced fracture of base of fifth metacarpal bone, right hand, initial encounter for closed fracture: Secondary | ICD-10-CM | POA: Insufficient documentation

## 2022-03-18 ENCOUNTER — Ambulatory Visit (INDEPENDENT_AMBULATORY_CARE_PROVIDER_SITE_OTHER): Payer: Self-pay

## 2022-03-18 ENCOUNTER — Ambulatory Visit (INDEPENDENT_AMBULATORY_CARE_PROVIDER_SITE_OTHER): Payer: Self-pay | Admitting: Orthopedic Surgery

## 2022-03-18 ENCOUNTER — Encounter: Payer: Self-pay | Admitting: Orthopedic Surgery

## 2022-03-18 DIAGNOSIS — S62346D Nondisplaced fracture of base of fifth metacarpal bone, right hand, subsequent encounter for fracture with routine healing: Secondary | ICD-10-CM

## 2022-03-18 NOTE — Progress Notes (Signed)
FOLLOW UP   Encounter Diagnosis  Name Primary?   Closed nondisplaced fracture of base of fifth metacarpal bone of right hand with routine healing, subsequent encounter 02/07/22 Yes     Chief Complaint  Patient presents with   Hand Injury    02/07/22 right 5th [facility]    The patient has 5 metacarpal base fracture    X-rays show fracture is healing  Clinical exam shows mild soreness no displacement dorsally  Patient is comfortable and will return to [place] on an as-needed basis understanding she may have some pain over the next 2 to 4 weeks

## 2022-03-18 NOTE — Patient Instructions (Signed)
See [place] as needed   May be sore for 2-4 more weeks

## 2022-03-25 ENCOUNTER — Ambulatory Visit (INDEPENDENT_AMBULATORY_CARE_PROVIDER_SITE_OTHER): Payer: Self-pay

## 2022-03-25 ENCOUNTER — Ambulatory Visit (INDEPENDENT_AMBULATORY_CARE_PROVIDER_SITE_OTHER): Payer: Self-pay | Admitting: Orthopedic Surgery

## 2022-03-25 DIAGNOSIS — S62346D Nondisplaced fracture of base of fifth metacarpal bone, right hand, subsequent encounter for fracture with routine healing: Secondary | ICD-10-CM

## 2022-03-25 DIAGNOSIS — S60221A Contusion of right hand, initial encounter: Secondary | ICD-10-CM

## 2022-03-25 NOTE — Progress Notes (Signed)
Chief Complaint  Patient presents with   Hand Pain    RT hand/ tripped and fell over dog DOI 02/07/22 (initial injury)     [name] is 56 years old she was recently released after being treated for a fracture of the base of the fifth metacarpal she tripped over her dog and reinjured her right hand  She is here for evaluation and management as a work in patient  The right hand is tender and swollen over the previous fracture site at the base of the fifth metacarpal  We will repeat the x-rays.  NO NEW FRACTURE   REC TYL, BRACE AND IBUPROFEN FU 5 WEEKS   Encounter Diagnoses  Name Primary?   Closed nondisplaced fracture of base of fifth metacarpal bone of right hand with routine healing, subsequent encounter Yes   Contusion of right hand, initial encounter

## 2022-03-25 NOTE — Patient Instructions (Signed)
BRACE , TYLENOL AND IBUPROFEN FOR 4 WEEKS   SEE ME IN 5 WEEKS

## 2022-07-08 ENCOUNTER — Telehealth: Payer: Self-pay | Admitting: Physician Assistant

## 2022-07-08 NOTE — Telephone Encounter (Signed)
Pt was called due to she never returned after initial appointment in May.  Got VM- left message asking patient to call office.

## 2022-08-06 ENCOUNTER — Other Ambulatory Visit: Payer: Self-pay | Admitting: Physician Assistant

## 2022-08-06 DIAGNOSIS — Z13220 Encounter for screening for lipoid disorders: Secondary | ICD-10-CM

## 2022-08-06 DIAGNOSIS — Z131 Encounter for screening for diabetes mellitus: Secondary | ICD-10-CM

## 2022-08-17 ENCOUNTER — Other Ambulatory Visit: Payer: Self-pay

## 2022-08-17 ENCOUNTER — Emergency Department (HOSPITAL_COMMUNITY)
Admission: EM | Admit: 2022-08-17 | Discharge: 2022-08-17 | Disposition: A | Discharge status: Home / Self Care | Payer: Self-pay | Attending: Student | Admitting: Student

## 2022-08-17 ENCOUNTER — Encounter (HOSPITAL_COMMUNITY): Payer: Self-pay | Admitting: Emergency Medicine

## 2022-08-17 ENCOUNTER — Other Ambulatory Visit (HOSPITAL_BASED_OUTPATIENT_CLINIC_OR_DEPARTMENT_OTHER): Payer: Self-pay

## 2022-08-17 ENCOUNTER — Emergency Department (HOSPITAL_COMMUNITY): Payer: Self-pay

## 2022-08-17 DIAGNOSIS — W2100XA Struck by hit or thrown ball, unspecified type, initial encounter: Secondary | ICD-10-CM | POA: Insufficient documentation

## 2022-08-17 DIAGNOSIS — M25561 Pain in right knee: Secondary | ICD-10-CM | POA: Insufficient documentation

## 2022-08-17 DIAGNOSIS — F17210 Nicotine dependence, cigarettes, uncomplicated: Secondary | ICD-10-CM | POA: Insufficient documentation

## 2022-08-17 MED ORDER — NAPROXEN 375 MG PO TABS
375.0000 mg | ORAL_TABLET | Freq: Two times a day (BID) | ORAL | 0 refills | Status: DC
  Filled 2022-08-17: qty 20, 10d supply, fill #0

## 2022-08-17 MED ORDER — KETOROLAC TROMETHAMINE 15 MG/ML IJ SOLN
15.0000 mg | Freq: Once | INTRAMUSCULAR | Status: AC
  Administered 2022-08-17: 15 mg via INTRAMUSCULAR
  Filled 2022-08-17: qty 1

## 2022-08-17 MED ORDER — LIDOCAINE 5 % EX PTCH: 1.0000 | MEDICATED_PATCH | CUTANEOUS | 0 refills | Status: AC

## 2022-08-17 MED ORDER — NAPROXEN 375 MG PO TABS: 375.0000 mg | ORAL_TABLET | Freq: Two times a day (BID) | ORAL | 0 refills | Status: DC

## 2022-08-17 MED ORDER — LIDOCAINE 5 % EX PTCH
1.0000 | MEDICATED_PATCH | CUTANEOUS | 0 refills | Status: DC
  Filled 2022-08-17: qty 30, 30d supply, fill #0

## 2022-08-17 MED ORDER — LIDOCAINE 5 % EX PTCH
1.0000 | MEDICATED_PATCH | CUTANEOUS | Status: DC
  Administered 2022-08-17: 1 via TRANSDERMAL
  Filled 2022-08-17: qty 1

## 2022-08-17 NOTE — ED Triage Notes (Addendum)
Pt reports right knee pain. Pt stating she hit her knee with a bowling ball while bowling.

## 2022-08-17 NOTE — ED Provider Notes (Signed)
[facility] EMERGENCY DEPARTMENT Provider Note  CSN: [id number] Arrival date & time: 08/17/22 7673  Chief Complaint(s) Knee Pain  HPI [name] is a 56 y.o. female who presents emergency department for evaluation of right knee pain.  Patient was bowling on Saturday, 08/15/2022 when she struck the right side of her knee with a bowling ball and she has had persistent right knee pain since.  She has been taking ibuprofen at home without relief.  Is able to bear weight but limited by pain.  Denies numbness, tingling, weakness of the extremity.  Denies additional traumatic or systemic complaints.   Past Medical History History reviewed. No pertinent past medical history. Patient Active Problem List   Diagnosis Date Noted   Closed nondisplaced fracture of base of fifth metacarpal bone of right hand 03/17/2022   Breast lump on left side at 3 o'clock position 12/13/2012   Pain in right axilla 12/13/2012   Home Medication(s) Prior to Admission medications   Medication Sig Start Date End Date Taking? Authorizing Provider  acetaminophen (TYLENOL) 500 MG tablet Take 500 mg by mouth every 6 (six) hours as needed.    [provider]  ibuprofen (ADVIL) 200 MG tablet Take 200 mg by mouth every 6 (six) hours as needed.    [provider]                                                                                                                                    Past Surgical History Past Surgical History:  Procedure Laterality Date   TONSILLECTOMY     TUBAL LIGATION     Family History Family History  Problem Relation Age of Onset   Heart disease Mother    Diabetes Mother    Hypertension Mother    COPD Father    Diabetes Maternal Grandmother    Hypertension Maternal Grandmother    Stroke Maternal Grandmother    Heart disease Maternal Grandmother     Social History Social History   Tobacco Use   Smoking status: Every Day    Packs/day: 0.50    Years: 31.00     Total pack years: 15.50    Types: Cigarettes   Smokeless tobacco: Never  Vaping Use   Vaping Use: Never used  Substance Use Topics   Alcohol use: Yes    Alcohol/week: 3.0 standard drinks of alcohol    Types: 3 Cans of beer per week    Comment: beer 3 x week   Drug use: Yes    Types: Marijuana    Comment: MJ daily   Allergies Percocet [oxycodone-acetaminophen] and Vicodin [hydrocodone-acetaminophen]  Review of Systems Review of Systems  Musculoskeletal:  Positive for arthralgias and myalgias.    Physical Exam Vital Signs  I have reviewed the triage vital signs BP 102/63   Pulse 79   Temp 97.9 F (36.6 C) (Oral)   Resp 16   LMP 11/02/2011  SpO2 98%   Physical Exam Vitals and nursing note reviewed.  Constitutional:      General: She is not in acute distress.    Appearance: She is well-developed.  HENT:     Head: Normocephalic and atraumatic.  Eyes:     Conjunctiva/sclera: Conjunctivae normal.  Cardiovascular:     Rate and Rhythm: Normal rate and regular rhythm.     Heart sounds: No murmur heard. Pulmonary:     Effort: Pulmonary effort is normal. No respiratory distress.     Breath sounds: Normal breath sounds.  Abdominal:     Palpations: Abdomen is soft.     Tenderness: There is no abdominal tenderness.  Musculoskeletal:        General: Tenderness present. No swelling.     Cervical back: Neck supple.  Skin:    General: Skin is warm and dry.     Capillary Refill: Capillary refill takes less than 2 seconds.  Neurological:     Mental Status: She is alert.  Psychiatric:        Mood and Affect: Mood normal.     ED Results and Treatments Labs (all labs ordered are listed, but only abnormal results are displayed) Labs Reviewed - No data to display                                                                                                                        Radiology DG Knee Complete 4 Views Right  Result Date: 08/17/2022 CLINICAL DATA:   Injury and right knee pain. EXAM: RIGHT KNEE - COMPLETE 4+ VIEW COMPARISON:  None Available. FINDINGS: Negative for fracture, dislocation or joint effusion. Normal alignment in the knee. Minimal joint space narrowing in the medial knee compartment. Minimal spurring in the knee compartments. IMPRESSION: No acute bone abnormality to the right knee. Electronically Signed   By: Richarda Overlie M.D.   On: 08/17/2022 09:15    Pertinent labs & imaging results that were available during my care of the patient were reviewed by me and considered in my medical decision making (see MDM for details).  Medications Ordered in ED Medications  lidocaine (LIDODERM) 5 % 1 patch (1 patch Transdermal Patch Applied 08/17/22 0913)  ketorolac (TORADOL) 15 MG/ML injection 15 mg (15 mg Intramuscular Given 08/17/22 0913)  Procedures Procedures  (including critical care time)  Medical Decision Making / ED Course   This patient presents to the ED for concern of knee pain, this involves an extensive number of treatment options, and is a complaint that carries with it a high risk of complications and morbidity.  The differential diagnosis includes contusion, fracture, hematoma, ligamentous injury  MDM: Patient seen emergency room for evaluation of right knee pain.  Physical exam with tenderness on the lateral aspect of the proximal tibia near the tibial plateau but is otherwise unremarkable.  X-rays reassuringly negative.  Neurologic exam unremarkable.  Patient given Lidoderm and Toradol and symptoms improved.  Patient placed in Ace wrap and given crutches for ambulation but encouraged to be weightbearing as tolerated.  She was given resources to follow-up outpatient with orthopedics if symptoms not improving and was discharged with outpatient follow-up.   Additional history obtained:  -External  records from outside source obtained and reviewed including: Chart review including previous notes, labs, imaging, consultation notes   Lab Tests: -I ordered, reviewed, and interpreted labs.   The pertinent results include:   Labs Reviewed - No data to display    Imaging Studies ordered: I ordered imaging studies including x-ray knee I independently visualized and interpreted imaging. I agree with the radiologist interpretation   Medicines ordered and prescription drug management: Meds ordered this encounter  Medications   ketorolac (TORADOL) 15 MG/ML injection 15 mg   lidocaine (LIDODERM) 5 % 1 patch    -I have reviewed the patients home medicines and have made adjustments as needed  Critical interventions none   Cardiac Monitoring: The patient was maintained on a cardiac monitor.  I personally viewed and interpreted the cardiac monitored which showed an underlying rhythm of: NSR  Social Determinants of Health:  Factors impacting patients care include: none   Reevaluation: After the interventions noted above, I reevaluated the patient and found that they have :improved  Co morbidities that complicate the patient evaluation History reviewed. No pertinent past medical history.    Dispostion: I considered admission for this patient, but she does not meet inpatient criteria for admission and is safe for discharge with outpatient follow-up     Final Clinical Impression(s) / ED Diagnoses Final diagnoses:  None     @PCDICTATION @    Teressa Lower, MD 08/17/22 [phone]

## 2022-08-19 ENCOUNTER — Ambulatory Visit: Payer: Medicaid Other | Admitting: Physician Assistant

## 2022-12-21 ENCOUNTER — Encounter (HOSPITAL_BASED_OUTPATIENT_CLINIC_OR_DEPARTMENT_OTHER): Payer: Self-pay | Admitting: Emergency Medicine

## 2022-12-21 ENCOUNTER — Other Ambulatory Visit: Payer: Self-pay

## 2022-12-21 ENCOUNTER — Emergency Department (HOSPITAL_BASED_OUTPATIENT_CLINIC_OR_DEPARTMENT_OTHER)
Admission: EM | Admit: 2022-12-21 | Discharge: 2022-12-21 | Disposition: A | Discharge status: Home / Self Care | Payer: Medicaid Other | Attending: Emergency Medicine | Admitting: Emergency Medicine

## 2022-12-21 ENCOUNTER — Emergency Department (HOSPITAL_BASED_OUTPATIENT_CLINIC_OR_DEPARTMENT_OTHER): Payer: Medicaid Other

## 2022-12-21 DIAGNOSIS — R1012 Left upper quadrant pain: Secondary | ICD-10-CM | POA: Insufficient documentation

## 2022-12-21 LAB — CBC
HCT: 39.6 % (ref 36.0–46.0)
Hemoglobin: 13.4 g/dL (ref 12.0–15.0)
MCH: 34 pg (ref 26.0–34.0)
MCHC: 33.8 g/dL (ref 30.0–36.0)
MCV: 100.5 fL — ABNORMAL HIGH (ref 80.0–100.0)
Platelets: 226 10*3/uL (ref 150–400)
RBC: 3.94 MIL/uL (ref 3.87–5.11)
RDW: 12.1 % (ref 11.5–15.5)
WBC: 7.4 10*3/uL (ref 4.0–10.5)
nRBC: 0 % (ref 0.0–0.2)

## 2022-12-21 LAB — COMPREHENSIVE METABOLIC PANEL
ALT: 30 U/L (ref 0–44)
AST: 20 U/L (ref 15–41)
Albumin: 3.6 g/dL (ref 3.5–5.0)
Alkaline Phosphatase: 81 U/L (ref 38–126)
Anion gap: 7 (ref 5–15)
BUN: 20 mg/dL (ref 6–20)
CO2: 24 mmol/L (ref 22–32)
Calcium: 9.1 mg/dL (ref 8.9–10.3)
Chloride: 107 mmol/L (ref 98–111)
Creatinine, Ser: 1.08 mg/dL — ABNORMAL HIGH (ref 0.44–1.00)
GFR, Estimated: >60 mL/min (ref >60)
Glucose, Bld: 107 mg/dL — ABNORMAL HIGH (ref 70–99)
Potassium: 4 mmol/L (ref 3.5–5.1)
Sodium: 138 mmol/L (ref 135–145)
Total Bilirubin: 0.2 mg/dL — ABNORMAL LOW (ref 0.3–1.2)
Total Protein: 5.8 g/dL — ABNORMAL LOW (ref 6.5–8.1)

## 2022-12-21 LAB — URINALYSIS, ROUTINE W REFLEX MICROSCOPIC
Bilirubin Urine: NEGATIVE
Glucose, UA: NEGATIVE mg/dL
Hgb urine dipstick: NEGATIVE
Ketones, ur: NEGATIVE mg/dL
Nitrite: POSITIVE — AB
Protein, ur: NEGATIVE mg/dL
Specific Gravity, Urine: >1.046 — ABNORMAL HIGH (ref 1.005–1.030)
pH: 6 (ref 5.0–8.0)

## 2022-12-21 LAB — LIPASE, BLOOD: Lipase: 50 U/L (ref 11–51)

## 2022-12-21 MED ORDER — IOHEXOL 300 MG/ML  SOLN
100.0000 mL | Freq: Once | INTRAMUSCULAR | Status: AC | PRN
  Administered 2022-12-21: 80 mL via INTRAVENOUS

## 2022-12-21 NOTE — ED Notes (Signed)
Pt not in room or bathroom and all of belongings are gone. Pt did not advise staff on her leaving. MD made aware.

## 2022-12-21 NOTE — ED Triage Notes (Signed)
LUQ abd pain,x 2 weeks intermittent  Worse, constant today Reports cramping, bloating. Patient observed undigested food in stool.

## 2022-12-21 NOTE — ED Provider Notes (Signed)
[facility] Provider Note   CSN: [id number] Arrival date & time: 12/21/22  1833     History  Chief Complaint  Patient presents with   Abdominal Pain    [name] is a 57 y.o. female.  Patient is a 57 year old female who presents with abdominal pain.  She states she has had some intermittent pain in her left abdomen for about 2 weeks but its gotten more persistent and constant.  She also reports some cramping when she is having a bowel movement and some feeling like her abdomen is bloated.  She says she has some chronic stool issues with intermittent constipation and loose stools.  She will frequently see undigested food in her stools.  She has not noticed any recent change in this.  She has had some nausea but no vomiting.  No fevers.  No urinary symptoms.       Home Medications Prior to Admission medications   Medication Sig Start Date End Date Taking? Authorizing Provider  acetaminophen (TYLENOL) 500 MG tablet Take 500 mg by mouth every 6 (six) hours as needed.    [provider]  ibuprofen (ADVIL) 200 MG tablet Take 200 mg by mouth every 6 (six) hours as needed.    [provider]  lidocaine (LIDODERM) 5 % Place 1 patch onto the skin daily. Remove & Discard patch within 12 hours or as directed by MD 08/17/22   Kommor, Debe Coder, MD  naproxen (NAPROSYN) 375 MG tablet Take 1 tablet (375 mg total) by mouth 2 (two) times daily. 08/17/22   Kommor, Debe Coder, MD      Allergies    Percocet [oxycodone-acetaminophen] and Vicodin [hydrocodone-acetaminophen]    Review of Systems   Review of Systems  Constitutional:  Negative for chills, diaphoresis, fatigue and fever.  HENT:  Negative for congestion, rhinorrhea and sneezing.   Eyes: Negative.   Respiratory:  Negative for cough, chest tightness and shortness of breath.   Cardiovascular:  Negative for chest pain and leg swelling.  Gastrointestinal:  Positive for abdominal  pain and nausea. Negative for blood in stool, diarrhea and vomiting.  Genitourinary:  Negative for difficulty urinating, flank pain, frequency and hematuria.  Musculoskeletal:  Negative for arthralgias and back pain.  Skin:  Negative for rash.  Neurological:  Negative for dizziness, speech difficulty, weakness, numbness and headaches.    Physical Exam Updated Vital Signs BP 99/67   Pulse (!) 55   Temp 97.8 F (36.6 C)   Resp 19   LMP 11/02/2011   SpO2 97%  Physical Exam Constitutional:      Appearance: She is well-developed.  HENT:     Head: Normocephalic and atraumatic.  Eyes:     Pupils: Pupils are equal, round, and reactive to light.  Cardiovascular:     Rate and Rhythm: Normal rate and regular rhythm.     Heart sounds: Normal heart sounds.  Pulmonary:     Effort: Pulmonary effort is normal. No respiratory distress.     Breath sounds: Normal breath sounds. No wheezing or rales.  Chest:     Chest wall: No tenderness.  Abdominal:     General: Bowel sounds are normal.     Palpations: Abdomen is soft.     Tenderness: There is abdominal tenderness in the left upper quadrant and left lower quadrant. There is no guarding or rebound.  Musculoskeletal:        General: Normal range of motion.     Cervical  back: Normal range of motion and neck supple.  Lymphadenopathy:     Cervical: No cervical adenopathy.  Skin:    General: Skin is warm and dry.     Findings: No rash.  Neurological:     Mental Status: She is alert and oriented to person, place, and time.     ED Results / Procedures / Treatments   Labs (all labs ordered are listed, but only abnormal results are displayed) Labs Reviewed  COMPREHENSIVE METABOLIC PANEL - Abnormal; Notable for the following components:      Result Value   Glucose, Bld 107 (*)    Creatinine, Ser 1.08 (*)    Total Protein 5.8 (*)    Total Bilirubin 0.2 (*)    All other components within normal limits  CBC - Abnormal; Notable for the  following components:   MCV 100.5 (*)    All other components within normal limits  URINALYSIS, ROUTINE W REFLEX MICROSCOPIC - Abnormal; Notable for the following components:   Specific Gravity, Urine >1.046 (*)    Nitrite POSITIVE (*)    Leukocytes,Ua TRACE (*)    Bacteria, UA FEW (*)    All other components within normal limits  URINE CULTURE  LIPASE, BLOOD    EKG None  Radiology CT Abdomen Pelvis W Contrast  Result Date: 12/21/2022 CLINICAL DATA:  Left lower quadrant abdominal pain EXAM: CT ABDOMEN AND PELVIS WITH CONTRAST TECHNIQUE: Multidetector CT imaging of the abdomen and pelvis was performed using the standard protocol following bolus administration of intravenous contrast. RADIATION DOSE REDUCTION: This exam was performed according to the departmental dose-optimization program which includes automated exposure control, adjustment of the mA and/or kV according to patient size and/or use of iterative reconstruction technique. CONTRAST:  51m OMNIPAQUE IOHEXOL 300 MG/ML  SOLN COMPARISON:  CT abdomen and pelvis 09/17/2007 FINDINGS: Lower chest: No acute abnormality. Hepatobiliary: No focal liver abnormality is seen. No gallstones, gallbladder wall thickening, or biliary dilatation. Pancreas: Unremarkable. No pancreatic ductal dilatation or surrounding inflammatory changes. Spleen: Normal in size without focal abnormality. Adrenals/Urinary Tract: There is a single punctate calculus in the right kidney. Otherwise, the kidneys, adrenal glands and bladder are within normal limits. Stomach/Bowel: Stomach is within normal limits. Appendix appears normal. No evidence of bowel wall thickening, distention, or inflammatory changes. Vascular/Lymphatic: Aortic atherosclerosis. No enlarged abdominal or pelvic lymph nodes. Reproductive: Uterus and bilateral adnexa are unremarkable. Other: No abdominal wall hernia or abnormality. No abdominopelvic ascites. Musculoskeletal: No acute or significant osseous  findings. IMPRESSION: 1. No acute localizing process in the abdomen or pelvis. 2. Nonobstructing right renal calculus. Aortic Atherosclerosis (ICD10-I70.0). Electronically Signed   By: ARonney AstersM.D.   On: 12/21/2022 20:42    Procedures Procedures    Medications Ordered in ED Medications  iohexol (OMNIPAQUE) 300 MG/ML solution 100 mL (80 mLs Intravenous Contrast Given 12/21/22 2022)    ED Course/ Medical Decision Making/ A&P                             Medical Decision Making Amount and/or Complexity of Data Reviewed Labs: ordered. Radiology: ordered.  Risk Prescription drug management.   Patient is a 57year old who presents with pain in her left upper abdomen.  No recent change in her stools.  No urinary symptoms.  No fevers.  No vomiting.  Her labs are nonconcerning.  Her CT scan does not show any acute abnormality.  No evidence of diverticulitis.  No obstruction.  Her urine does have some suggestions of infection but she does not currently have any urinary symptoms.  Patient eloped from the ED without telling anyone prior to my reevaluation or advising her of any findings.  Final Clinical Impression(s) / ED Diagnoses Final diagnoses:  Left upper quadrant abdominal pain    Rx / DC Orders ED Discharge Orders     None         Malvin Johns, MD 12/21/22 2259

## 2022-12-24 LAB — URINE CULTURE: Culture: >=100000 — AB

## 2022-12-25 ENCOUNTER — Telehealth (HOSPITAL_BASED_OUTPATIENT_CLINIC_OR_DEPARTMENT_OTHER): Payer: Self-pay | Admitting: *Deleted

## 2022-12-25 NOTE — Telephone Encounter (Signed)
Post ED Visit - Positive Culture Follow-up  Culture report reviewed by antimicrobial stewardship pharmacist: [facility] Team []  Elenor Quinones, Pharm.D. []  Heide Guile, Pharm.D., BCPS AQ-ID []  Parks Neptune, Pharm.D., BCPS []  Alycia Rossetti, Pharm.D., BCPS []  [place], [place].D., BCPS, AAHIVP []  Legrand Como, Pharm.D., BCPS, AAHIVP []  Salome Arnt, PharmD, BCPS []  Johnnette Gourd, PharmD, BCPS []  Hughes Better, PharmD, BCPS []  Leeroy Cha, PharmD []  Laqueta Linden, PharmD, BCPS []  Albertina Parr, PharmD  [facility] Team []  Leodis Sias, PharmD []  Lindell Spar, PharmD []  Royetta Asal, PharmD []  Graylin Shiver, Rph []  Rema Fendt) Glennon Mac, PharmD []  Arlyn Dunning, PharmD []  Netta Cedars, PharmD []  Dia Sitter, PharmD []  Leone Haven, PharmD []  Gretta Arab, PharmD []  Theodis Shove, PharmD []  Peggyann Juba, PharmD []  Reuel Boom, PharmD   Positive urine culture Asymptomatic bacteruria and no further patient follow-up is required at this time.  [facility] PA  Harlon Flor Lott 12/25/2022, 8:13 AM

## 2023-07-30 ENCOUNTER — Encounter (HOSPITAL_COMMUNITY): Payer: Self-pay | Admitting: *Deleted

## 2023-07-30 ENCOUNTER — Other Ambulatory Visit: Payer: Self-pay

## 2023-07-30 ENCOUNTER — Emergency Department (HOSPITAL_COMMUNITY): Payer: Medicaid Other

## 2023-07-30 ENCOUNTER — Emergency Department (HOSPITAL_COMMUNITY)
Admission: EM | Admit: 2023-07-30 | Discharge: 2023-07-30 | Disposition: A | Discharge status: Home / Self Care | Payer: Medicaid Other | Attending: Emergency Medicine | Admitting: Emergency Medicine

## 2023-07-30 DIAGNOSIS — M25552 Pain in left hip: Secondary | ICD-10-CM | POA: Insufficient documentation

## 2023-07-30 DIAGNOSIS — R519 Headache, unspecified: Secondary | ICD-10-CM | POA: Diagnosis present

## 2023-07-30 DIAGNOSIS — X501XXA Overexertion from prolonged static or awkward postures, initial encounter: Secondary | ICD-10-CM | POA: Diagnosis not present

## 2023-07-30 DIAGNOSIS — S22080A Wedge compression fracture of T11-T12 vertebra, initial encounter for closed fracture: Secondary | ICD-10-CM | POA: Insufficient documentation

## 2023-07-30 DIAGNOSIS — M549 Dorsalgia, unspecified: Secondary | ICD-10-CM

## 2023-07-30 MED ORDER — KETOROLAC TROMETHAMINE 30 MG/ML IJ SOLN
30.0000 mg | Freq: Once | INTRAMUSCULAR | Status: AC
  Administered 2023-07-30: 30 mg via INTRAMUSCULAR
  Filled 2023-07-30: qty 1

## 2023-07-30 NOTE — ED Notes (Signed)
Patient ambulatory to nurses station reports that she has to leave due to her ride is here and she has no other way home. PA explains to patient this will be leaving AMA. Patient understands and ensured phone number on chart is correct

## 2023-07-30 NOTE — ED Triage Notes (Signed)
Pt states on Sunday she was moving a bed and when the bed landed on the floor it "jarred" her whole body  Pt states she has had limited movement in the last 4 days due to the pain

## 2023-07-30 NOTE — ED Provider Notes (Signed)
[place] EMERGENCY DEPARTMENT AT [facility] Provider Note   CSN: [id number] Arrival date & time: 07/30/23  1018     History  Chief Complaint  Patient presents with   Back Pain    [name] is a 57 y.o. female with no past medical history presents to emergency department following injury on Sunday.  Patient reports that she was lifting a twin size bed from the bedroom to the living room.  As she was putting it down, she lost her strength and went down with the bed.  She was not using her legs to lower bed.  She reports difficulty with ambulation following an inability to get a ride to be evaluated.  She has Medicaid and was worried it would not be covered if she called for EMS.  She denies head injury, headache, blurred vision, dizziness, syncope, saddle paresthesia, urinary or fecal incontinence.   Back Pain Associated symptoms: no abdominal pain, no chest pain, no fever, no headaches, no numbness and no weakness        Home Medications Prior to Admission medications   Medication Sig Start Date End Date Taking? Authorizing Provider  acetaminophen (TYLENOL) 500 MG tablet Take 500 mg by mouth every 6 (six) hours as needed.    [provider]  ibuprofen (ADVIL) 200 MG tablet Take 200 mg by mouth every 6 (six) hours as needed.    [provider]  lidocaine (LIDODERM) 5 % Place 1 patch onto the skin daily. Remove & Discard patch within 12 hours or as directed by MD 08/17/22   Kommor, Wyn Forster, MD  naproxen (NAPROSYN) 375 MG tablet Take 1 tablet (375 mg total) by mouth 2 (two) times daily. 08/17/22   Kommor, Wyn Forster, MD      Allergies    Percocet [oxycodone-acetaminophen] and Vicodin [hydrocodone-acetaminophen]    Review of Systems   Review of Systems  Constitutional:  Negative for chills, fatigue and fever.  Respiratory:  Negative for cough, chest tightness, shortness of breath and wheezing.   Cardiovascular:  Negative for chest pain and  palpitations.  Gastrointestinal:  Negative for abdominal pain, constipation, diarrhea, nausea and vomiting.  Musculoskeletal:  Positive for back pain.       Hip pain  Neurological:  Negative for dizziness, seizures, weakness, light-headedness, numbness and headaches.    Physical Exam Updated Vital Signs BP 112/68   Pulse 87   Temp 98.3 F (36.8 C) (Oral)   Resp 20   Ht 5\' 5"  (1.651 m)   Wt 56.7 kg   LMP 11/02/2011   SpO2 98%   BMI 20.80 kg/m  Physical Exam Vitals and nursing note reviewed.  Constitutional:      General: She is not in acute distress.    Appearance: Normal appearance. She is not diaphoretic.  HENT:     Head: Normocephalic and atraumatic.     Comments: No battle sign No hematoma    Right Ear: External ear normal.     Left Ear: External ear normal.     Ears:     Comments: No hemotympanum    Nose: Nose normal.     Comments: No epistaxis nor dried blood in nostrils bilaterally    Mouth/Throat:     Comments: No damage to tongue Eyes:     General:        Right eye: No discharge.        Left eye: No discharge.     Extraocular Movements: Extraocular movements intact.  Conjunctiva/sclera: Conjunctivae normal.     Pupils: Pupils are equal, round, and reactive to light.     Comments: No subconjunctival hemorrhage, hyphema, tear drop pupil, or fluid leakage bilaterally  Neck:     Vascular: No carotid bruit.     Comments: No crepitus, step-off, deformity to neck or spine No masses palpated Cardiovascular:     Rate and Rhythm: Normal rate.     Pulses: Normal pulses.     Comments: Radial pulses 2+ equal bilaterally Dorsalis pedis 2+ equal bilaterally Pulmonary:     Effort: Pulmonary effort is normal. No respiratory distress.     Breath sounds: Normal breath sounds. No wheezing.  Chest:     Chest wall: No tenderness.  Abdominal:     General: Bowel sounds are normal. There is no distension.     Palpations: Abdomen is soft.     Tenderness: There is no  abdominal tenderness.  Musculoskeletal:     Cervical back: Normal range of motion and neck supple. No rigidity or tenderness.     Comments: TTP of midline spine and thoracic and lumbar region TTP to left lumbar musculature No obvious deformity to joints or long bones Pelvis stable with no shortening or rotation of LE bilaterally TTP of left hip Ambulated without difficulty  Skin:    General: Skin is warm and dry.     Capillary Refill: Capillary refill takes less than 2 seconds.  Neurological:     General: No focal deficit present.     Mental Status: She is alert and oriented to person, place, and time. Mental status is at baseline.     GCS: GCS eye subscore is 4. GCS verbal subscore is 5. GCS motor subscore is 6.     Cranial Nerves: Cranial nerves 2-12 are intact. No cranial nerve deficit.     Sensory: Sensation is intact. No sensory deficit.     Motor: Motor function is intact. No weakness or tremor.     Coordination: Coordination is intact. Finger-Nose-Finger Test and Heel to [place] Test normal.     Gait: Gait is intact.     Deep Tendon Reflexes: Reflexes are normal and symmetric.     Comments: Acting following commands appropriately     ED Results / Procedures / Treatments   Labs (all labs ordered are listed, but only abnormal results are displayed) Labs Reviewed - No data to display  EKG None  Radiology DG HIP UNILAT WITH PELVIS 2-3 VIEWS LEFT  Result Date: 07/30/2023 CLINICAL DATA:  Pain. EXAM: DG HIP (WITH OR WITHOUT PELVIS) 2-3V LEFT COMPARISON:  None Available. FINDINGS: Pelvis is intact with normal and symmetric sacroiliac joints. No acute fracture or dislocation. No aggressive osseous lesion. Visualized sacral arcuate lines are unremarkable. Unremarkable symphysis pubis. There are mild degenerative changes of bilateral hip joints without significant joint space narrowing. Osteophytosis of the superior acetabulum. No radiopaque foreign bodies. IMPRESSION: 1. No acute  osseous abnormalities. 2. 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 Electronically Signed   By: Jules Schick M.D.   On: 07/30/2023 14:54   DG Lumbar Spine Complete  Result Date: 07/30/2023 CLINICAL DATA:  Upper and lower back pain for 5 days. EXAM: THORACIC SPINE 2 VIEWS; LUMBAR SPINE - COMPLETE 4+ VIEW COMPARISON:  CT scan abdomen and pelvis from 12/21/2022. FINDINGS: There are 5 nonrib-bearing lumbar vertebrae. Thoracic kyphosis and lumbar lordosis are maintained. No spondylolisthesis. No spondylolysis of the lumbar spine. Mildly reduced height of T12 vertebrae, new since the prior study  from 12/21/2022. No significant retropulsion or spinal canal compromise. Remaining vertebral body heights are maintained. No aggressive osseous lesion. Mild multilevel degenerative changes in the form of facet arthropathy and marginal osteophyte formation. Sacroiliac joints are symmetric. Visualized soft tissues are within normal limits. IMPRESSION: 1. Mildly reduced height of T12 vertebrae, new since the prior study from December 21, 2022. 2. Mild multilevel degenerative changes. Electronically Signed   By: Jules Schick M.D.   On: 07/30/2023 14:52   DG Thoracic Spine 2 View  Result Date: 07/30/2023 CLINICAL DATA:  Upper and lower back pain for 5 days. EXAM: THORACIC SPINE 2 VIEWS; LUMBAR SPINE - COMPLETE 4+ VIEW COMPARISON:  CT scan abdomen and pelvis from 12/21/2022. FINDINGS: There are 5 nonrib-bearing lumbar vertebrae. Thoracic kyphosis and lumbar lordosis are maintained. No spondylolisthesis. No spondylolysis of the lumbar spine. Mildly reduced height of T12 vertebrae, new since the prior study from 12/21/2022. No significant retropulsion or spinal canal compromise. Remaining vertebral body heights are maintained. No aggressive osseous lesion. Mild multilevel degenerative changes in the form of facet arthropathy and marginal osteophyte formation. Sacroiliac joints are symmetric. Visualized soft tissues are  within normal limits. IMPRESSION: 1. Mildly reduced height of T12 vertebrae, new since the prior study from December 21, 2022. 2. Mild multilevel degenerative changes. Electronically Signed   By: Jules Schick M.D.   On: 07/30/2023 14:52    Procedures Procedures    Medications Ordered in ED Medications  ketorolac (TORADOL) 30 MG/ML injection 30 mg (30 mg Intramuscular Given 07/30/23 1138)    ED Course/ Medical Decision Making/ A&P                                 Medical Decision Making Amount and/or Complexity of Data Reviewed Radiology: ordered.  Risk Prescription drug management.     Patient presents to the ED for concern of back pain following injury on Sunday, this involves an extensive number of treatment options, and is a complaint that carries with it a high risk of complications and morbidity.  The differential diagnosis includes fracture, contusion, muscles sprain, compression fx   Co morbidities that complicate the patient evaluation  None   Additional history obtained:  Additional history obtained from Nursing and Outside Medical Records   External records from outside source obtained and reviewed upon chart review    Imaging Studies ordered:  I ordered imaging studies including x-ray of thoracic, lumbar spine, x-ray of hips bilaterally I independently visualized and interpreted imaging which showed a reduced height of T12 vertebrae that is new since 12/21/2022, no additional acute fractures Final interpretation was provided following patient leaving ED AMA I agree with the radiologist interpretation    Medicines ordered and prescription drug management:  I ordered medication including Toradol for pain management Reevaluation of the patient after these medicines showed that the patient stayed the same I have reviewed the patients home medicines and have made adjustments as needed    Consultations Obtained:  I was unable to consult with neurosurgery  as patient left AMA   Problem List / ED Course:  Back pain   Reevaluation:  After the interventions noted above, I reevaluated the patient and found that they have :stayed the same    Dispostion:  Patient is standing upon assessment as she reports difficulty with bending over secondary to pain.  Vital signs WNL.  See HPI.  Upon assessment, patient reports tenderness to palpation of  thoracic and lumbar midline spine.  She also has tenderness to left lumbar paraspinous musculature.  No crepitus, step-off, deformity noted to neck or back.  No skin changes or ecchymosis noted to back or hips bilaterally.  She is ambulatory without difficulty.  She is neurologically intact with no paresthesia.  My concern for cauda equina is low due to no saddle anesthesia, incontinence, concerning HPI.  She denies head injury, visual disturbances, seizures, LOC following incident.  She has no additional complaints of pain or injuries and no additional or found during assessment. Will provide Toradol for pain management while waiting for x-ray imaging.  1450 patient reports that her ride is here and she has no other means of transportation home so must leave now prior to imaging being read by radiologist and final disposition.  I discussed importance of remaining in ED for pain and additional management especially if there is a fracture. No additional pain management nor additional advice was able to be given.   Date: 07/30/2023 Patient: [name] Admitted: 07/30/2023 10:42 AM Attending Provider: No att. providers found  Charlaine Dalton or her authorized caregiver has made the decision for the patient to leave the emergency department against the advice of Dr. Deretha Emory.  She or her authorized caregiver has been informed and understands the inherent risks, including death.  She or her authorized caregiver has decided to accept the responsibility for this decision. Charlaine Dalton and all necessary  parties have been advised that she may return for further evaluation or treatment. Her condition at time of discharge was Stable.  Charlaine Dalton had current vital signs as follows:  Blood pressure 112/68, pulse 87, temperature 98.3 F (36.8 C), temperature source Oral, resp. rate 20, height 5\' 5"  (1.651 m), weight 56.7 kg, last menstrual period 11/02/2011, SpO2 98%.   Charlaine Dalton or her authorized caregiver has signed the Leaving Against Medical Advice form prior to leaving the department.  Judithann Sheen 07/30/2023  1615 called noted alternative contact person on chart.  She confirmed patient's  name and date of birth.  I informed her of mild T12 compression fracture found on x-rays and told her to follow-up with primary care provider regarding further management and pain control. She may use lidocaine patches, tylenol or ibuprofen as needed for pain in mean time.  Discussed return precautions to include but not limited to, saddle anesthesia, incontinence, worsening pain, inability to walk, paresthesia, weakness of 1 side.  Discussed patient with Dr. Deretha Emory who agrees with plan        Final Clinical Impression(s) / ED Diagnoses Final diagnoses:  Acute back pain, unspecified back location, unspecified back pain laterality  Compression fracture of T12 vertebra, initial encounter [facility])    Rx / DC Orders ED Discharge Orders     None         Judithann Sheen, PA 07/30/23 1635    Vanetta Mulders, MD 07/31/23 1023

## 2024-02-24 ENCOUNTER — Other Ambulatory Visit (HOSPITAL_BASED_OUTPATIENT_CLINIC_OR_DEPARTMENT_OTHER): Payer: Self-pay

## 2024-02-24 ENCOUNTER — Emergency Department (HOSPITAL_BASED_OUTPATIENT_CLINIC_OR_DEPARTMENT_OTHER)

## 2024-02-24 ENCOUNTER — Emergency Department (HOSPITAL_BASED_OUTPATIENT_CLINIC_OR_DEPARTMENT_OTHER)
Admission: EM | Admit: 2024-02-24 | Discharge: 2024-02-24 | Disposition: A | Discharge status: Home / Self Care | Attending: Emergency Medicine | Admitting: Emergency Medicine

## 2024-02-24 ENCOUNTER — Encounter (HOSPITAL_BASED_OUTPATIENT_CLINIC_OR_DEPARTMENT_OTHER): Payer: Self-pay

## 2024-02-24 ENCOUNTER — Other Ambulatory Visit: Payer: Self-pay

## 2024-02-24 DIAGNOSIS — M25561 Pain in right knee: Secondary | ICD-10-CM | POA: Diagnosis present

## 2024-02-24 MED ORDER — MELOXICAM 15 MG PO TABS
15.0000 mg | ORAL_TABLET | Freq: Every day | ORAL | 0 refills | Status: AC | PRN
  Filled 2024-02-24: qty 30, 30d supply, fill #0

## 2024-02-24 MED ORDER — KETOROLAC TROMETHAMINE 30 MG/ML IJ SOLN
30.0000 mg | Freq: Once | INTRAMUSCULAR | Status: AC
  Administered 2024-02-24: 30 mg via INTRAMUSCULAR
  Filled 2024-02-24: qty 1

## 2024-02-24 NOTE — ED Triage Notes (Signed)
 Pt to ED c/o right knee pain x 5 days, progressively getting worse. Reports actively moving, unsure if injured self.

## 2024-02-24 NOTE — ED Notes (Signed)
Reviewed discharge instructions, medications, and home care with pt. Pt verbalized understanding and had no further questions. Pt exited ED without complications.

## 2024-02-24 NOTE — ED Provider Notes (Signed)
 [place] EMERGENCY DEPARTMENT AT [facility] Provider Note   CSN: [id number] Arrival date & time: 02/24/24  1237     History  Chief Complaint  Patient presents with   Knee Pain    right    [name] is a 58 y.o. female.   Knee Pain   58 year old female presents emergency department complaints of right-sided knee pain.  States she is to be having pain for the past 5 days.  States that she has recently been moving in between houses with increased physical activity.  Denies any known trauma/fall/twisting knee.  States the pain is constant but occasionally she gets a crippling pain almost like her right knee catches with certain steps.  Denies any weakness or sensory deficits in the leg.  Has not been taking no medication for her symptoms.  Presents emergency department for further assessment.  No significant pertinent past medical history. Home Medications Prior to Admission medications   Medication Sig Start Date End Date Taking? Authorizing Provider  acetaminophen  (TYLENOL ) 500 MG tablet Take 500 mg by mouth every 6 (six) hours as needed.    [provider]  ibuprofen  (ADVIL ) 200 MG tablet Take 200 mg by mouth every 6 (six) hours as needed.    [provider]  lidocaine  (LIDODERM ) 5 % Place 1 patch onto the skin daily. Remove & Discard patch within 12 hours or as directed by MD 08/17/22   Kommor, Alyse July, MD  naproxen  (NAPROSYN ) 375 MG tablet Take 1 tablet (375 mg total) by mouth 2 (two) times daily. 08/17/22   Kommor, Alyse July, MD      Allergies    Percocet [oxycodone -acetaminophen ] and Vicodin [hydrocodone -acetaminophen ]    Review of Systems   Review of Systems  All other systems reviewed and are negative.   Physical Exam Updated Vital Signs BP (!) 101/58 (BP Location: Right Arm)   Pulse 77   Temp 98.3 F (36.8 C) (Oral)   Resp 18   Ht 5\' 5"  (1.651 m)   Wt 57.6 kg   LMP 11/02/2011   SpO2 95%   BMI 21.13 kg/m  Physical  Exam Vitals and nursing note reviewed.  Constitutional:      General: She is not in acute distress.    Appearance: She is well-developed.  HENT:     Head: Normocephalic and atraumatic.  Eyes:     Conjunctiva/sclera: Conjunctivae normal.  Cardiovascular:     Rate and Rhythm: Normal rate and regular rhythm.     Heart sounds: No murmur heard. Pulmonary:     Effort: Pulmonary effort is normal. No respiratory distress.     Breath sounds: Normal breath sounds.  Abdominal:     Palpations: Abdomen is soft.     Tenderness: There is no abdominal tenderness.  Musculoskeletal:        General: No swelling.     Cervical back: Neck supple.  Skin:    General: Skin is warm and dry.     Capillary Refill: Capillary refill takes less than 2 seconds.  Neurological:     Mental Status: She is alert.  Psychiatric:        Mood and Affect: Mood normal.     ED Results / Procedures / Treatments   Labs (all labs ordered are listed, but only abnormal results are displayed) Labs Reviewed - No data to display  EKG None  Radiology No results found.  Procedures Procedures    Medications Ordered in ED Medications  ketorolac  (TORADOL ) 30 MG/ML  injection 30 mg (has no administration in time range)    ED Course/ Medical Decision Making/ A&P                                 Medical Decision Making Amount and/or Complexity of Data Reviewed Radiology: ordered.  Risk Prescription drug management.   This patient presents to the ED for concern of knee pain, this involves an extensive number of treatment options, and is a complaint that carries with it a high risk of complications and morbidity.  The differential diagnosis includes fracture, strain/pain, dislocation, ligament/tendon injury, neurovascular compromise, septic arthritis, osteoarthritis, other   Co morbidities that complicate the patient evaluation  See HPI   Additional history obtained:  Additional history obtained from  [facility] External records from outside source obtained and reviewed including hospital records   Lab Tests:  N/a   Imaging Studies ordered:  I ordered imaging studies including right knee x-ray  I independently visualized and interpreted imaging which showed no acute osseous abnormality I agree with the radiologist interpretation   Cardiac Monitoring: / EKG:  N/a   Consultations Obtained:  N/a   Problem List / ED Course / Critical interventions / Medication management  Right knee pain I ordered medication including toradol     Reevaluation of the patient after these medicines showed that the patient improved I have reviewed the patients home medicines and have made adjustments as needed   Social Determinants of Health:  Cigarette use.  Denies illicit drug use.   Test / Admission - Considered:  Right knee pain Vitals signs within normal range and stable throughout visit. Imaging studies significant for: See above 58 year old female presents emergency department complaints of right-sided knee pain.  States she is to be having pain for the past 5 days.  States that she has recently been moving in between houses with increased physical activity.  Denies any known trauma/fall/twisting knee.  States the pain is constant but occasionally she gets a crippling pain almost like her right knee catches with certain steps.  Denies any weakness or sensory deficits in the leg.  Has not been taking no medication for her symptoms.  Presents emergency department for further assessment. On exam, tenderness right knee as above.  No obvious joint laxity appreciated.  No overlying skin changes concerning for second infectious process.  No lower extremity edema concerning for DVT.  No pulse deficits to suggest ischemic limb.  X-ray obtained by triage staff of which is negative for any acute osseous abnormality.  Concern for possible meniscal injury given reported history.  Treated with Toradol  and  did not improvement while in the ED.  Will recommend rest, ice, elevation, NSAIDs as well as compression.  Patient already has establish care with orthopedics through Dr. Phyllis Breeze in [place].  Will refer back to into his practice.  Treatment plan discussed with patient and she acknowledged understanding was agreeable to said plan.  Patient will well-appearing, afebrile in no acute distress. Worrisome signs and symptoms were discussed with the patient, and the patient acknowledged understanding to return to the ED if noticed. Patient was stable upon discharge.          Final Clinical Impression(s) / ED Diagnoses Final diagnoses:  None    Rx / DC Orders ED Discharge Orders     None         Avera Butter, [place] 02/24/24 1422    Afton Horse  T, DO 02/25/24 [id number]

## 2024-02-24 NOTE — ED Notes (Signed)
 Knee brace applied to right knee and crutches provided. Pt demonstrated proper use and education provided.

## 2024-02-24 NOTE — Discharge Instructions (Signed)
 As discussed, your x-ray did not show evidence of fracture or dislocation.  I am concerned that you may have a meniscal injury or other ligament injury inside your knee.  Will recommend using knee brace for protection as well as stability and using crutches to help aid in walking around.  Will send referral back into orthopedics to follow-up with.  Also recommend icing your knee as well as elevating above above your heart to help with swelling.  Please do not hesitate to return to the emergency department if the worrisome signs and symptoms we discussed become apparent.

## 2024-03-09 ENCOUNTER — Encounter: Admitting: Orthopaedic Surgery

## 2024-03-16 ENCOUNTER — Ambulatory Visit: Payer: Self-pay | Admitting: Orthopedic Surgery

## 2024-06-02 ENCOUNTER — Encounter: Payer: Self-pay | Admitting: Radiology

## 2024-08-14 ENCOUNTER — Encounter: Payer: Self-pay | Admitting: Radiology
